# Patient Record
Sex: Female | Born: 1970 | Hispanic: No | Marital: Married | Smoking: Never smoker
Health system: Southern US, Community
[De-identification: ages and names within clinical notes are randomized; demographics above are authoritative.]

## PROBLEM LIST (undated history)

## (undated) DIAGNOSIS — T7840XA Allergy, unspecified, initial encounter: Secondary | ICD-10-CM

## (undated) HISTORY — DX: Allergy, unspecified, initial encounter: T78.40XA

## (undated) HISTORY — PX: NO PAST SURGERIES: SHX2092

---

## 2005-11-27 ENCOUNTER — Ambulatory Visit: Payer: Self-pay

## 2006-01-07 ENCOUNTER — Ambulatory Visit: Payer: Self-pay | Admitting: Obstetrics and Gynecology

## 2006-01-11 ENCOUNTER — Ambulatory Visit: Payer: Self-pay | Admitting: Obstetrics and Gynecology

## 2006-07-09 ENCOUNTER — Ambulatory Visit: Payer: Self-pay | Admitting: Obstetrics and Gynecology

## 2007-01-09 ENCOUNTER — Ambulatory Visit: Payer: Self-pay | Admitting: Obstetrics and Gynecology

## 2007-02-02 ENCOUNTER — Emergency Department: Payer: Self-pay | Admitting: Emergency Medicine

## 2008-03-10 ENCOUNTER — Ambulatory Visit: Payer: Self-pay | Admitting: Obstetrics and Gynecology

## 2009-03-14 ENCOUNTER — Ambulatory Visit: Payer: Self-pay | Admitting: Obstetrics and Gynecology

## 2010-03-15 ENCOUNTER — Ambulatory Visit: Payer: Self-pay | Admitting: Obstetrics and Gynecology

## 2010-03-16 ENCOUNTER — Ambulatory Visit: Payer: Self-pay | Admitting: Obstetrics and Gynecology

## 2011-03-19 ENCOUNTER — Ambulatory Visit: Payer: Self-pay | Admitting: Obstetrics and Gynecology

## 2012-03-21 ENCOUNTER — Ambulatory Visit: Payer: Self-pay | Admitting: Obstetrics and Gynecology

## 2013-03-23 ENCOUNTER — Ambulatory Visit: Payer: Self-pay

## 2013-06-30 ENCOUNTER — Emergency Department: Payer: Self-pay | Admitting: Emergency Medicine

## 2013-06-30 LAB — CBC WITH DIFFERENTIAL/PLATELET
BASOS ABS: 0.1 10*3/uL (ref 0.0–0.1)
BASOS PCT: 0.8 %
EOS ABS: 0.5 10*3/uL (ref 0.0–0.7)
Eosinophil %: 5 %
HCT: 34.9 % — ABNORMAL LOW (ref 35.0–47.0)
HGB: 10.7 g/dL — AB (ref 12.0–16.0)
LYMPHS ABS: 1.9 10*3/uL (ref 1.0–3.6)
Lymphocyte %: 20.7 %
MCH: 21.2 pg — AB (ref 26.0–34.0)
MCHC: 30.8 g/dL — ABNORMAL LOW (ref 32.0–36.0)
MCV: 69 fL — ABNORMAL LOW (ref 80–100)
Monocyte #: 0.5 x10 3/mm (ref 0.2–0.9)
Monocyte %: 5.6 %
NEUTROS ABS: 6.2 10*3/uL (ref 1.4–6.5)
Neutrophil %: 67.9 %
PLATELETS: 515 10*3/uL — AB (ref 150–440)
RBC: 5.07 10*6/uL (ref 3.80–5.20)
RDW: 16.1 % — AB (ref 11.5–14.5)
WBC: 9.1 10*3/uL (ref 3.6–11.0)

## 2013-06-30 LAB — COMPREHENSIVE METABOLIC PANEL
AST: 21 U/L (ref 15–37)
Albumin: 3.8 g/dL (ref 3.4–5.0)
Alkaline Phosphatase: 67 U/L
Anion Gap: 7 (ref 7–16)
BILIRUBIN TOTAL: 0.4 mg/dL (ref 0.2–1.0)
BUN: 5 mg/dL — ABNORMAL LOW (ref 7–18)
CALCIUM: 9 mg/dL (ref 8.5–10.1)
Chloride: 100 mmol/L (ref 98–107)
Co2: 27 mmol/L (ref 21–32)
Creatinine: 0.8 mg/dL (ref 0.60–1.30)
EGFR (African American): >60
GLUCOSE: 112 mg/dL — AB (ref 65–99)
OSMOLALITY: 266 (ref 275–301)
Potassium: 3.4 mmol/L — ABNORMAL LOW (ref 3.5–5.1)
SGPT (ALT): 31 U/L (ref 12–78)
SODIUM: 134 mmol/L — AB (ref 136–145)
TOTAL PROTEIN: 8.2 g/dL (ref 6.4–8.2)

## 2013-06-30 LAB — D-DIMER(ARMC): D-DIMER: 550 ng/mL

## 2013-06-30 LAB — URINALYSIS, COMPLETE
BACTERIA: NONE SEEN
BILIRUBIN, UR: NEGATIVE
Glucose,UR: NEGATIVE mg/dL (ref 0–75)
KETONE: NEGATIVE
LEUKOCYTE ESTERASE: NEGATIVE
NITRITE: NEGATIVE
Ph: 6 (ref 4.5–8.0)
Protein: NEGATIVE
RBC, UR: NONE SEEN /HPF (ref 0–5)
Specific Gravity: 1.002 (ref 1.003–1.030)
Squamous Epithelial: <1

## 2013-06-30 LAB — TROPONIN I: Troponin-I: <0.02 ng/mL

## 2013-06-30 LAB — LIPASE, BLOOD: LIPASE: 75 U/L (ref 73–393)

## 2014-03-24 ENCOUNTER — Ambulatory Visit: Payer: Self-pay

## 2014-09-09 ENCOUNTER — Emergency Department: Payer: BLUE CROSS/BLUE SHIELD

## 2014-09-09 ENCOUNTER — Encounter: Payer: Self-pay | Admitting: Emergency Medicine

## 2014-09-09 ENCOUNTER — Emergency Department
Admission: EM | Admit: 2014-09-09 | Discharge: 2014-09-09 | Disposition: A | Discharge status: Home / Self Care | Payer: BLUE CROSS/BLUE SHIELD | Attending: Emergency Medicine | Admitting: Emergency Medicine

## 2014-09-09 DIAGNOSIS — R109 Unspecified abdominal pain: Secondary | ICD-10-CM | POA: Diagnosis present

## 2014-09-09 DIAGNOSIS — Z3202 Encounter for pregnancy test, result negative: Secondary | ICD-10-CM | POA: Diagnosis not present

## 2014-09-09 DIAGNOSIS — N390 Urinary tract infection, site not specified: Secondary | ICD-10-CM | POA: Diagnosis not present

## 2014-09-09 LAB — COMPREHENSIVE METABOLIC PANEL
ALBUMIN: 4 g/dL (ref 3.5–5.0)
ALT: 18 U/L (ref 14–54)
AST: 20 U/L (ref 15–41)
Alkaline Phosphatase: 57 U/L (ref 38–126)
Anion gap: 8 (ref 5–15)
BUN: 7 mg/dL (ref 6–20)
CALCIUM: 9 mg/dL (ref 8.9–10.3)
CO2: 24 mmol/L (ref 22–32)
CREATININE: 0.8 mg/dL (ref 0.44–1.00)
Chloride: 103 mmol/L (ref 101–111)
GFR calc Af Amer: >60 mL/min (ref >60)
Glucose, Bld: 106 mg/dL — ABNORMAL HIGH (ref 65–99)
POTASSIUM: 4.1 mmol/L (ref 3.5–5.1)
SODIUM: 135 mmol/L (ref 135–145)
Total Bilirubin: 0.2 mg/dL — ABNORMAL LOW (ref 0.3–1.2)
Total Protein: 8 g/dL (ref 6.5–8.1)

## 2014-09-09 LAB — URINALYSIS COMPLETE WITH MICROSCOPIC (ARMC ONLY)
BILIRUBIN URINE: NEGATIVE
GLUCOSE, UA: NEGATIVE mg/dL
HGB URINE DIPSTICK: NEGATIVE
KETONES UR: NEGATIVE mg/dL
NITRITE: POSITIVE — AB
Protein, ur: 100 mg/dL — AB
Specific Gravity, Urine: 1.016 (ref 1.005–1.030)
pH: 7 (ref 5.0–8.0)

## 2014-09-09 LAB — CBC
HEMATOCRIT: 32.2 % — AB (ref 35.0–47.0)
Hemoglobin: 10 g/dL — ABNORMAL LOW (ref 12.0–16.0)
MCH: 20.1 pg — ABNORMAL LOW (ref 26.0–34.0)
MCHC: 31 g/dL — AB (ref 32.0–36.0)
MCV: 64.9 fL — ABNORMAL LOW (ref 80.0–100.0)
PLATELETS: 595 10*3/uL — AB (ref 150–440)
RBC: 4.97 MIL/uL (ref 3.80–5.20)
RDW: 17.5 % — ABNORMAL HIGH (ref 11.5–14.5)
WBC: 11 10*3/uL (ref 3.6–11.0)

## 2014-09-09 LAB — POCT PREGNANCY, URINE: PREG TEST UR: NEGATIVE

## 2014-09-09 LAB — LIPASE, BLOOD: Lipase: 43 U/L (ref 22–51)

## 2014-09-09 MED ORDER — ONDANSETRON HCL 4 MG/2ML IJ SOLN: 4.0000 mg | Freq: Once | INTRAMUSCULAR | Status: DC

## 2014-09-09 MED ORDER — MORPHINE SULFATE 4 MG/ML IJ SOLN
INTRAMUSCULAR | Status: AC
  Filled 2014-09-09: qty 1

## 2014-09-09 MED ORDER — MORPHINE SULFATE 4 MG/ML IJ SOLN: 4.0000 mg | Freq: Once | INTRAMUSCULAR | Status: DC

## 2014-09-09 MED ORDER — ONDANSETRON HCL 4 MG/2ML IJ SOLN
INTRAMUSCULAR | Status: AC
  Filled 2014-09-09: qty 2

## 2014-09-09 MED ORDER — CIPROFLOXACIN HCL 500 MG PO TABS: 500.0000 mg | ORAL_TABLET | Freq: Two times a day (BID) | ORAL | Status: AC

## 2014-09-09 NOTE — ED Provider Notes (Signed)
[facility] Emergency Department Provider Note  ____________________________________________  Time seen: On arrival  I have reviewed the triage vital signs and the nursing notes.   HISTORY  Chief Complaint Abdominal Pain     HPI Joan Hunt is a 44 y.o. female who presents with complaints of right-sided abdominal pain for possibly 5 days. The pain has become more severe and she describes it as crampy and sharp. No fevers no chills. No nausea no vomiting. Normal bowel movements. The pain does seem to worsen after eating. No recent injury. No dysuria.     History reviewed. No pertinent past medical history.  There are no active problems to display for this patient.   History reviewed. No pertinent past surgical history.  No current outpatient prescriptions on file.  Allergies Septra  History reviewed. No pertinent family history.  Social History History  Substance Use Topics  . Smoking status: Never Smoker   . Smokeless tobacco: Not on file  . Alcohol Use: No    Review of Systems  Constitutional: Negative for fever. Eyes: Negative for visual changes. ENT: Negative for sore throat Cardiovascular: Negative for chest pain. Respiratory: Negative for shortness of breath. Gastrointestinal: Negative for  vomiting and diarrhea. Genitourinary: Negative for dysuria. Musculoskeletal: Negative for back pain. Skin: Negative for rash. Neurological: Negative for headaches or focal weakness Psychiatric: no anxiety or depression  10-point ROS otherwise negative.  ____________________________________________   PHYSICAL EXAM:  VITAL SIGNS: ED Triage Vitals  Enc Vitals Group     BP 09/09/14 0852 94/80 mmHg     Pulse Rate 09/09/14 0852 81     Resp --      Temp 09/09/14 0852 98.5 F (36.9 C)     Temp Source 09/09/14 0852 Oral     SpO2 09/09/14 0852 100 %     Weight 09/09/14 0852 158 lb (71.668 kg)     Height 09/09/14 0852 5\' 2"  (1.575 m)    Head Cir --      Peak Flow --      Pain Score 09/09/14 0847 6     Pain Loc --      Pain Edu? --      Excl. in GC? --      Constitutional: Alert and oriented. Well appearing and in no distress. Eyes: Conjunctivae are normal.  ENT   Head: Normocephalic and atraumatic.   Mouth/Throat: Mucous membranes are moist. Cardiovascular: Normal rate, regular rhythm. Normal and symmetric distal pulses are present in all extremities. No murmurs, rubs, or gallops. Respiratory: Normal respiratory effort without tachypnea nor retractions. Breath sounds are clear and equal bilaterally.  Gastrointestinal: Mild tenderness to palpation in the right lower quadrant and right upper quadrant. No rebound tenderness and no evidence of peritonitis. No distention. There is no CVA tenderness. Genitourinary: deferred Musculoskeletal: Nontender with normal range of motion in all extremities. No lower extremity tenderness nor edema. Neurologic:  Normal speech and language. No gross focal neurologic deficits are appreciated. Skin:  Skin is warm, dry and intact. No rash noted. Psychiatric: Mood and affect are normal. Patient exhibits appropriate insight and judgment.  ____________________________________________    LABS (pertinent positives/negatives)  Labs Reviewed  CBC  COMPREHENSIVE METABOLIC PANEL  LIPASE, BLOOD  URINALYSIS COMPLETEWITH MICROSCOPIC ([facility] ONLY)    ____________________________________________   EKG  None  ____________________________________________    RADIOLOGY  CT scan of abdomen and pelvis unremarkable, patient has known fibroids  Ultrasound of pelvis unremarkable   ____________________________________________   PROCEDURES  Procedure(s)  performed: none  Critical Care performed: none  ____________________________________________   INITIAL IMPRESSION / ASSESSMENT AND PLAN / ED COURSE  Pertinent labs & imaging results that were available during my care of the  patient were reviewed by me and considered in my medical decision making (see chart for details).  Overall patient well-appearing , with mild discomfort in the abdomen to be worsening. We will check labs, give IV fluids, IV pain medication, IV Zofran and reevaluate   ----------------------------------------- 12:32 PM on 09/09/2014 ----------------------------------------- Labs CT scan unremarkable except for likely urinary tract infection which is probably the source of the patient's discomfort. We will obtain ultrasound  ____________________________________________ Ultrasound normal  FINAL CLINICAL IMPRESSION(S) / ED DIAGNOSES  Final diagnoses:  Flank pain  UTI (lower urinary tract infection)     Jene Every, MD 09/09/14 1515

## 2014-09-09 NOTE — Discharge Instructions (Signed)

## 2014-09-09 NOTE — ED Notes (Signed)
RLQ pain for the last several days. No fever. Has had some diarrhea.

## 2015-01-18 ENCOUNTER — Other Ambulatory Visit: Payer: Self-pay | Admitting: Obstetrics and Gynecology

## 2015-01-18 DIAGNOSIS — Z1231 Encounter for screening mammogram for malignant neoplasm of breast: Secondary | ICD-10-CM

## 2015-03-28 ENCOUNTER — Ambulatory Visit
Admission: RE | Admit: 2015-03-28 | Discharge: 2015-03-28 | Disposition: A | Discharge status: Home / Self Care | Payer: BLUE CROSS/BLUE SHIELD | Source: Ambulatory Visit | Attending: Obstetrics and Gynecology | Admitting: Obstetrics and Gynecology

## 2015-03-28 DIAGNOSIS — Z1231 Encounter for screening mammogram for malignant neoplasm of breast: Secondary | ICD-10-CM | POA: Diagnosis not present

## 2016-01-19 ENCOUNTER — Other Ambulatory Visit: Payer: Self-pay | Admitting: Obstetrics and Gynecology

## 2016-01-19 DIAGNOSIS — Z1231 Encounter for screening mammogram for malignant neoplasm of breast: Secondary | ICD-10-CM

## 2016-01-19 DIAGNOSIS — Z01419 Encounter for gynecological examination (general) (routine) without abnormal findings: Secondary | ICD-10-CM | POA: Diagnosis not present

## 2016-03-21 DIAGNOSIS — J029 Acute pharyngitis, unspecified: Secondary | ICD-10-CM | POA: Diagnosis not present

## 2016-03-21 DIAGNOSIS — R6889 Other general symptoms and signs: Secondary | ICD-10-CM | POA: Diagnosis not present

## 2016-03-29 ENCOUNTER — Ambulatory Visit: Payer: BLUE CROSS/BLUE SHIELD

## 2016-04-13 DIAGNOSIS — M791 Myalgia: Secondary | ICD-10-CM | POA: Diagnosis not present

## 2016-04-13 DIAGNOSIS — R05 Cough: Secondary | ICD-10-CM | POA: Diagnosis not present

## 2016-04-26 ENCOUNTER — Ambulatory Visit
Admission: RE | Admit: 2016-04-26 | Discharge: 2016-04-26 | Disposition: A | Discharge status: Home / Self Care | Payer: BLUE CROSS/BLUE SHIELD | Source: Ambulatory Visit | Attending: Obstetrics and Gynecology | Admitting: Obstetrics and Gynecology

## 2016-04-26 DIAGNOSIS — Z1231 Encounter for screening mammogram for malignant neoplasm of breast: Secondary | ICD-10-CM | POA: Diagnosis not present

## 2016-06-07 DIAGNOSIS — J019 Acute sinusitis, unspecified: Secondary | ICD-10-CM | POA: Diagnosis not present

## 2016-11-05 DIAGNOSIS — N762 Acute vulvitis: Secondary | ICD-10-CM | POA: Diagnosis not present

## 2016-11-22 DIAGNOSIS — N9089 Other specified noninflammatory disorders of vulva and perineum: Secondary | ICD-10-CM | POA: Diagnosis not present

## 2016-12-16 DIAGNOSIS — Z23 Encounter for immunization: Secondary | ICD-10-CM | POA: Diagnosis not present

## 2017-03-20 ENCOUNTER — Other Ambulatory Visit: Payer: Self-pay | Admitting: Obstetrics and Gynecology

## 2017-03-20 DIAGNOSIS — Z01419 Encounter for gynecological examination (general) (routine) without abnormal findings: Secondary | ICD-10-CM | POA: Diagnosis not present

## 2017-03-20 DIAGNOSIS — Z1231 Encounter for screening mammogram for malignant neoplasm of breast: Secondary | ICD-10-CM

## 2017-03-22 LAB — HM PAP SMEAR: HM PAP: NEGATIVE

## 2017-04-26 DIAGNOSIS — R6889 Other general symptoms and signs: Secondary | ICD-10-CM | POA: Diagnosis not present

## 2017-04-26 DIAGNOSIS — J019 Acute sinusitis, unspecified: Secondary | ICD-10-CM | POA: Diagnosis not present

## 2017-04-26 DIAGNOSIS — B9689 Other specified bacterial agents as the cause of diseases classified elsewhere: Secondary | ICD-10-CM | POA: Diagnosis not present

## 2017-04-26 DIAGNOSIS — B373 Candidiasis of vulva and vagina: Secondary | ICD-10-CM | POA: Diagnosis not present

## 2017-04-29 ENCOUNTER — Ambulatory Visit
Admission: RE | Admit: 2017-04-29 | Discharge: 2017-04-29 | Disposition: A | Discharge status: Home / Self Care | Payer: BLUE CROSS/BLUE SHIELD | Source: Ambulatory Visit | Attending: Obstetrics and Gynecology | Admitting: Obstetrics and Gynecology

## 2017-04-29 DIAGNOSIS — Z1231 Encounter for screening mammogram for malignant neoplasm of breast: Secondary | ICD-10-CM | POA: Insufficient documentation

## 2017-05-24 IMAGING — MG MM DIGITAL SCREENING BILAT W/ CAD
4 series · 4 of 4 positions shown · non-contrast
Comparison: Previous exam(s).

CLINICAL DATA: Screening.

EXAM:
DIGITAL SCREENING BILATERAL MAMMOGRAM WITH CAD

[R CC]
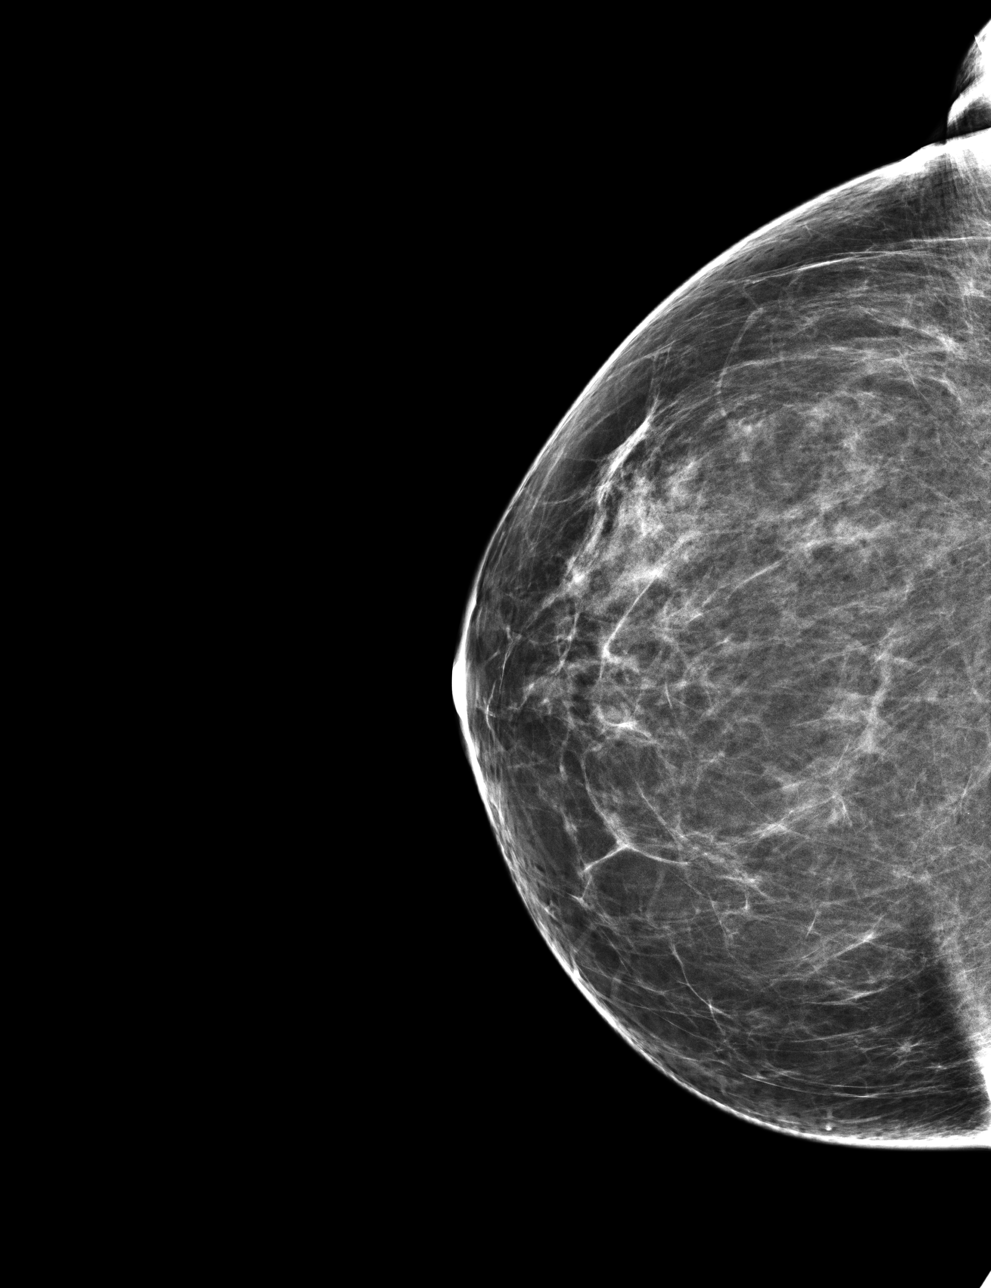

[L MLO]
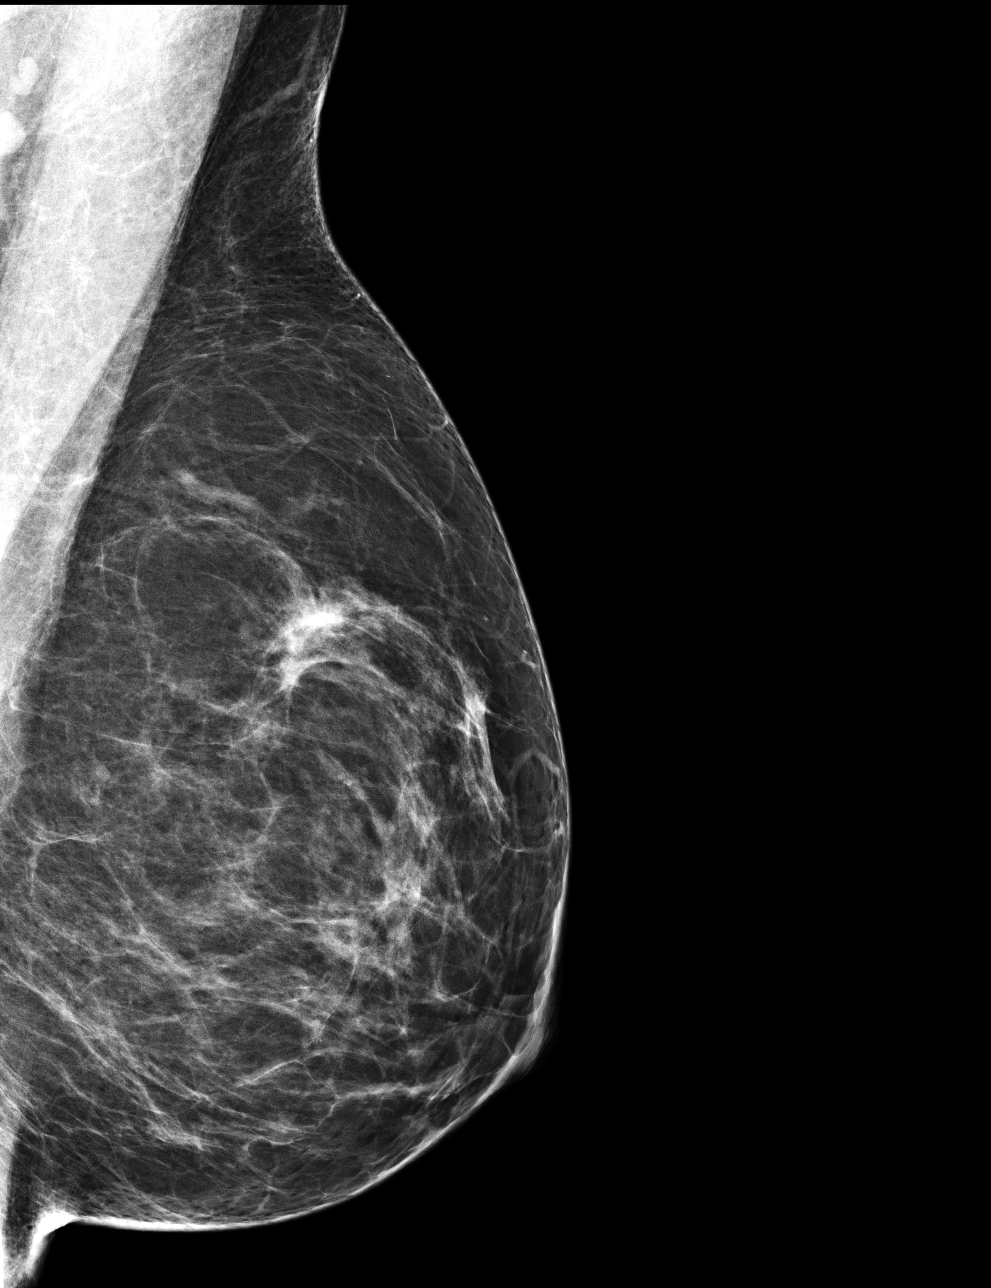

[R MLO]
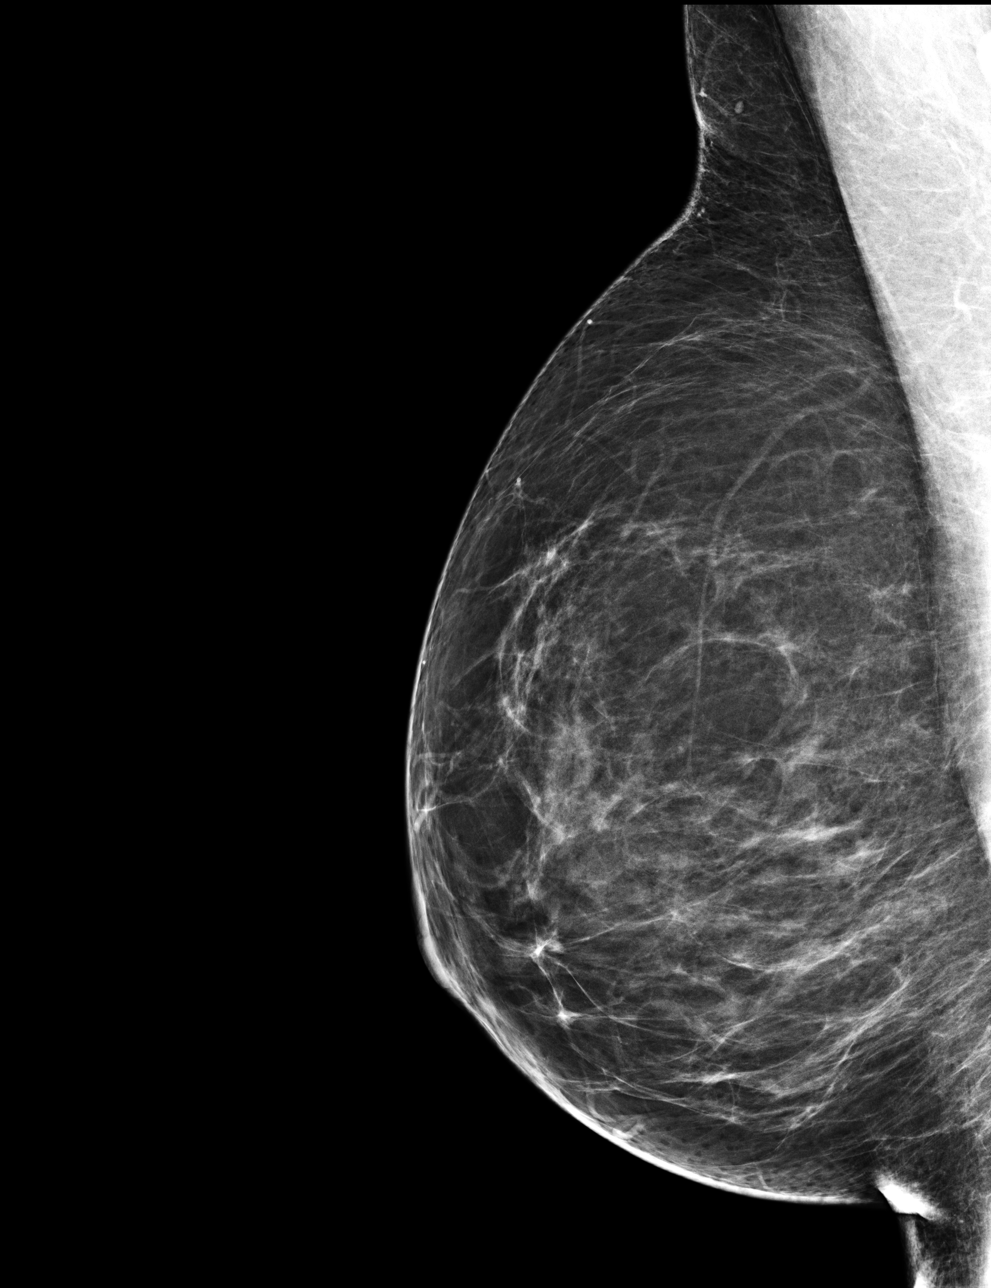

[L CC]
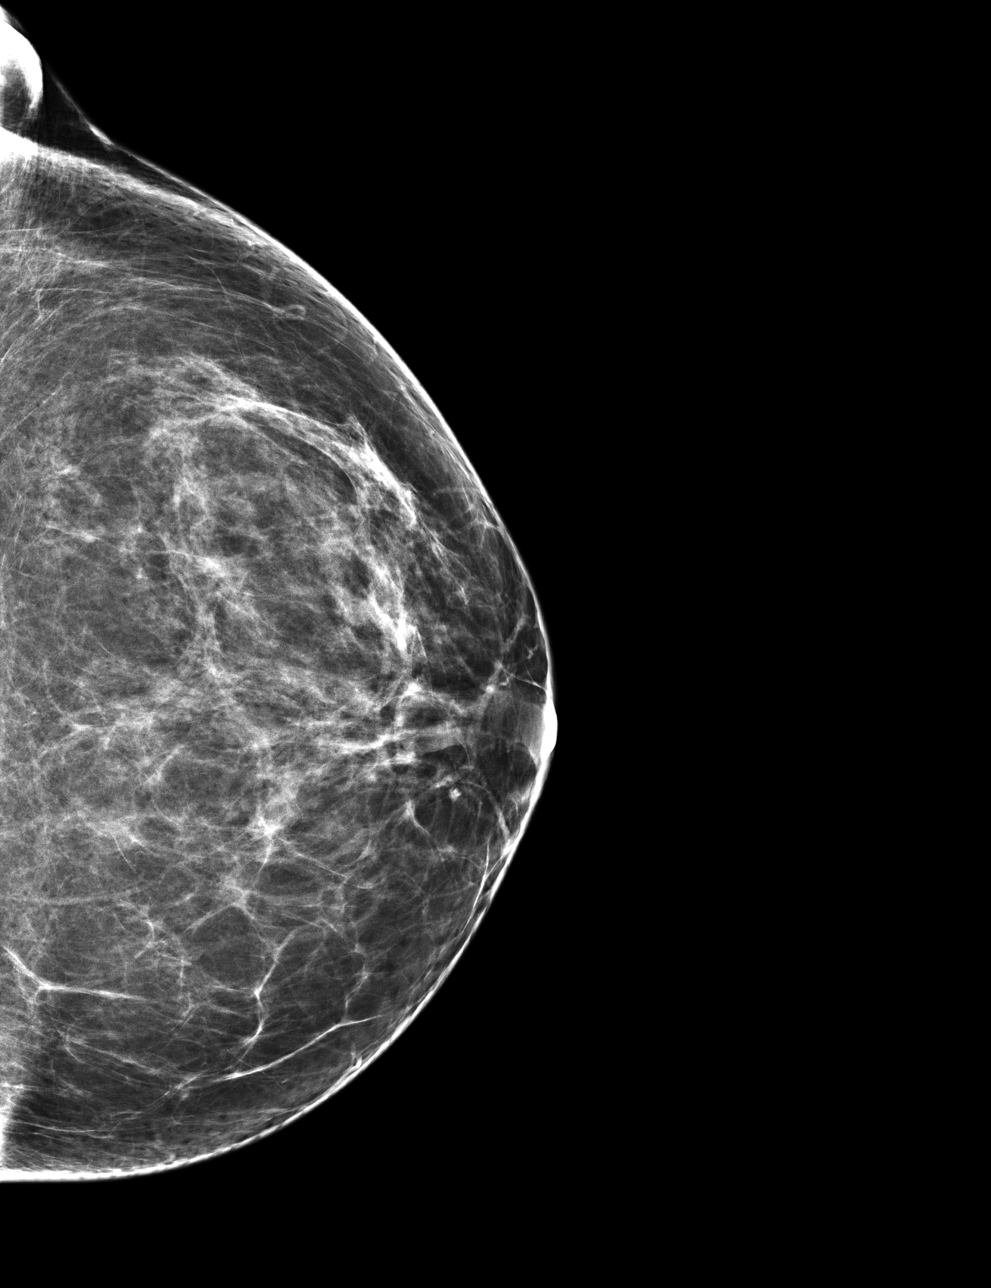

[4 of 4 positions shown; findings below may reference images not displayed]

ACR Breast Density Category b: There are scattered areas of
fibroglandular density.
FINDINGS: There are no findings suspicious for malignancy. Images were
processed with CAD.
IMPRESSION: No mammographic evidence of malignancy. A result letter of this
screening mammogram will be mailed directly to the patient.

RECOMMENDATION:
Screening mammogram in one year. (Code:AS-G-LCT)

BI-RADS CATEGORY  1: Negative.

## 2017-06-03 ENCOUNTER — Encounter: Payer: Self-pay | Admitting: Family Medicine

## 2017-06-03 ENCOUNTER — Ambulatory Visit (INDEPENDENT_AMBULATORY_CARE_PROVIDER_SITE_OTHER): Payer: BLUE CROSS/BLUE SHIELD | Admitting: Family Medicine

## 2017-06-03 VITALS — BP 102/72 | HR 91 | Temp 97.9°F | Resp 16 | Ht 62.5 in | Wt 170.0 lb

## 2017-06-03 DIAGNOSIS — J302 Other seasonal allergic rhinitis: Secondary | ICD-10-CM

## 2017-06-03 DIAGNOSIS — L918 Other hypertrophic disorders of the skin: Secondary | ICD-10-CM | POA: Diagnosis not present

## 2017-06-03 DIAGNOSIS — Z6830 Body mass index (BMI) 30.0-30.9, adult: Secondary | ICD-10-CM

## 2017-06-03 DIAGNOSIS — D649 Anemia, unspecified: Secondary | ICD-10-CM | POA: Diagnosis not present

## 2017-06-03 DIAGNOSIS — E669 Obesity, unspecified: Secondary | ICD-10-CM | POA: Diagnosis not present

## 2017-06-03 DIAGNOSIS — J309 Allergic rhinitis, unspecified: Secondary | ICD-10-CM | POA: Insufficient documentation

## 2017-06-03 DIAGNOSIS — I8390 Asymptomatic varicose veins of unspecified lower extremity: Secondary | ICD-10-CM | POA: Insufficient documentation

## 2017-06-03 DIAGNOSIS — I8393 Asymptomatic varicose veins of bilateral lower extremities: Secondary | ICD-10-CM | POA: Diagnosis not present

## 2017-06-03 DIAGNOSIS — R0602 Shortness of breath: Secondary | ICD-10-CM | POA: Diagnosis not present

## 2017-06-03 NOTE — Assessment & Plan Note (Signed)
Suspect that this is related to deconditioning Patient still has a functional level of exercise without shortness of breath Discussed red flag symptoms for cardiac disease Follow-up as needed

## 2017-06-03 NOTE — Assessment & Plan Note (Signed)
Patient with multiple skin tags around her necklace line  Advised that these could be removed if desired

## 2017-06-03 NOTE — Progress Notes (Signed)
Patient: Joan Hunt, Female    DOB: 1970/04/18, 47 y.o.   MRN: [id number] Visit Date: 06/03/2017  Today's Provider: Shirlee Latch, MD   I, Joan Hunt, CMA, am acting as scribe for Joan Latch, MD.  Chief Complaint  Patient presents with  . Establish Care   Subjective:    Establish Care Joan Hunt is a 47 y.o. female who presents today to establish care. She feels fairly well. She is c/o leg pain. Her family history includes vascular disease, and she is asking if she needs to see a specialist regarding this. She is also concerned about skin changes, and would like to know how much calcium she should have daily. She reports exercising irregularly; is walking some. She reports she is sleeping fairly well.  Last pap- 03/22/2017- NIL; HPV negative Last mammogram- 04/29/2017- BI-RADS 1  Varicose veins: Patient is concerned about varicose veins in bilateral legs.  She states that her legs are sore and this is worse after standing for long periods of time.  She has minimal ankle swelling intermittently.  This is worse after eating high sodium meals or standing for long periods of time.  The veins are more prominent in the backs of her legs.  She is concerned because her maternal Hunt "vein problems "as she aged.  Patient states that she is out of shape.  She stopped exercising for a while and now that she has started back, she feels short of breath when she is exercising.  She is still able to walk up a flight of stairs and walk from the parking lot without becoming short of breath.  She denies any diaphoresis, nausea, chest pain that accompanies this exercise.  Patient is concerned "moles" around her neck.  States her Joan Hunt has several of these as well.  They have not changed in shape or size, do not bleed, are not painful.  Patient reports seasonal allergy symptoms.  She previously had several sinus infections annually, but this has resolved since she  moved to working in a newer building.  She is not taking any allergy medications currently. -----------------------------------------------------------------   Review of Systems  Constitutional: Positive for fatigue. Negative for activity change, appetite change, chills, diaphoresis, fever and unexpected weight change.  HENT: Positive for congestion, dental problem, sinus pressure and sneezing. Negative for drooling, ear discharge, ear pain, facial swelling, hearing loss, mouth sores, nosebleeds, postnasal drip, rhinorrhea, sinus pain, sore throat, tinnitus, trouble swallowing and voice change.   Eyes: Positive for redness. Negative for photophobia, pain, discharge, itching and visual disturbance.  Respiratory: Positive for cough and shortness of breath. Negative for apnea, choking, chest tightness, wheezing and stridor.   Cardiovascular: Negative.   Gastrointestinal: Negative.   Endocrine: Negative.   Genitourinary: Negative.   Musculoskeletal: Positive for back pain and myalgias. Negative for arthralgias, gait problem, joint swelling, neck pain and neck stiffness.  Skin: Negative.   Allergic/Immunologic: Negative.   Neurological: Positive for headaches. Negative for dizziness, tremors, seizures, syncope, facial asymmetry, speech difficulty, weakness, light-headedness and numbness.  Hematological: Negative.   Psychiatric/Behavioral: Negative.     Social History      She  reports that she has never smoked. She has never used smokeless tobacco. She reports that she does not drink alcohol or use drugs.       Social History   Socioeconomic History  . Marital status: Married    Spouse name: Joan Hunt.  . Number of children: 2  .  Years of education: Not on file  . Highest education level: Master's degree (e.g., MA, MS, MEng, MEd, MSW, MBA)  Occupational History  . Occupation: unemployed  Social Needs  . Financial resource strain: Not hard at all  . Food insecurity:    Worry:  Never true    Inability: Never true  . Transportation needs:    Medical: No    Non-medical: No  Tobacco Use  . Smoking status: Never Smoker  . Smokeless tobacco: Never Used  Substance and Sexual Activity  . Alcohol use: No  . Drug use: Never  . Sexual activity: Yes    Birth control/protection: Patch  Lifestyle  . Physical activity:    Days per week: 0 days    Minutes per session: 0 min  . Stress: Not on file  Relationships  . Social connections:    Talks on phone: Not on file    Gets together: Not on file    Attends religious service: Not on file    Active member of club or organization: Not on file    Attends meetings of clubs or organizations: Not on file    Relationship status: Not on file  Other Topics Concern  . Not on file  Social History Narrative  . Not on file    Past Medical History:  Diagnosis Date  . Allergy      There are no active problems to display for this patient.   Past Surgical History:  Procedure Laterality Date  . NO PAST SURGERIES      Family History        Family Status  Relation Name Status  . Joan Hunt  (Not Specified)  . Joan Hunt  Alive  . Joan Hunt  Alive  . Joan Hunt  Alive  . Joan Hunt  Alive  . Joan Hunt  Alive  . Joan Hunt  Alive  . Joan Hunt  (Not Specified)  . Joan Hunt  (Not Specified)  . Joan Hunt  Alive  . Joan Hunt  (Not Specified)        Her family history includes Allergic rhinitis in her Joan Hunt; Breast cancer in her maternal Hunt; Cancer in her maternal Hunt; Diabetes in her maternal Hunt; Emphysema in her maternal grandfather; Goiter in her Joan Hunt; Healthy in her Joan Hunt and Joan Hunt; Heart block in her Joan Hunt; Heart murmur in her Joan Hunt and Joan Hunt; Hyperthyroidism in her Joan Hunt; Sickle cell trait in her Joan Hunt; Stroke in her paternal grandmother. There is no history of Colon cancer, Cervical cancer, or Ovarian cancer.      Allergies  Allergen Reactions  . Septra [Sulfamethoxazole-Trimethoprim] Anaphylaxis  . Prednisone Other (See Comments)     "feel edgy"     Current Outpatient Medications:  .  calcium carbonate (TUMS - DOSED IN MG ELEMENTAL CALCIUM) 500 MG chewable tablet, Chew 2 tablets by mouth daily., Disp: , Rfl:  .  XULANE 150-35 MCG/24HR transdermal patch, PLACE 1 PATCH ONTO THE SKIN ONCE A WEEK., Disp: , Rfl: 3   Patient Care Team: Erasmo DownerBacigalupo, Landrey M, MD as PCP - General (Family Medicine)      Objective:   Vitals: BP 102/72 (BP Location: Left Arm, Patient Position: Sitting, Cuff Size: Normal)   Pulse 91   Temp 97.9 F (36.6 C) (Oral)   Resp 16   Ht 5' 2.5" (1.588 m)   Wt 170 lb (77.1 kg)   LMP 05/29/2017   SpO2 98%   BMI 30.60 kg/m    Vitals:   06/03/17 1007  BP: 102/72  Pulse:  91  Resp: 16  Temp: 97.9 F (36.6 C)  TempSrc: Oral  SpO2: 98%  Weight: 170 lb (77.1 kg)  Height: 5' 2.5" (1.588 m)     Physical Exam  Constitutional: She is oriented to person, place, and time. She appears well-developed and well-nourished. No distress.  HENT:  Head: Normocephalic and atraumatic.  Right Ear: External ear normal.  Left Ear: External ear normal.  Nose: Nose normal.  Mouth/Throat: Oropharynx is clear and moist. No oropharyngeal exudate.  Eyes: Pupils are equal, round, and reactive to light. Conjunctivae and EOM are normal. Right eye exhibits no discharge. Left eye exhibits no discharge. No scleral icterus.  Neck: Neck supple. No thyromegaly present.  Cardiovascular: Normal rate, regular rhythm, normal heart sounds and intact distal pulses.  No murmur heard. Pulmonary/Chest: Effort normal and breath sounds normal. No respiratory distress. She has no wheezes. She has no rales.  Abdominal: Soft. Bowel sounds are normal. She exhibits no distension. There is no tenderness. There is no rebound and no guarding.  Musculoskeletal: She exhibits edema (trace, bilateral ankles). She exhibits no deformity.  Faint superficial vein changes in bilateral legs  Lymphadenopathy:    She has no cervical adenopathy.    Neurological: She is alert and oriented to person, place, and time.  Skin: Skin is warm and dry. No rash noted.  Psychiatric: She has a normal mood and affect. Her behavior is normal.  Vitals reviewed.    Depression Screen PHQ 2/9 Scores 06/03/2017  PHQ - 2 Score 0     Assessment & Plan:     Problem List Items Addressed This Visit      Cardiovascular and Mediastinum   Varicose vein of leg - Primary    Minimal and not painful Advised elevation of legs, compression socks, low-sodium diet to avoid edema        Respiratory   Allergic rhinitis    Continue symptomatic management Could consider Flonase or daily antihistamine in the future        Musculoskeletal and Integument   Skin tag    Patient with multiple skin tags around her necklace line  Advised that these could be removed if desired        Other   Shortness of breath on exertion    Suspect that this is related to deconditioning Patient still has a functional level of exercise without shortness of breath Discussed red flag symptoms for cardiac disease Follow-up as needed       Other Visit Diagnoses    Class 1 obesity without serious comorbidity with body mass index (BMI) of 30.0 to 30.9 in adult, unspecified obesity type       Relevant Medications   calcium carbonate (TUMS - DOSED IN MG ELEMENTAL CALCIUM) 500 MG chewable tablet   Other Relevant Orders   Lipid panel   Comprehensive metabolic panel   Anemia, unspecified type       Relevant Orders   CBC w/Diff/Platelet      Reviewed notes from gynecology as well as most recent labs from 2016  Return in about 1 year (around 06/04/2018) for physical.   The entirety of the information documented in the History of Present Illness, Review of Systems and Physical Exam were personally obtained by me. Portions of this information were initially documented by Irving Burton Ratchford, CMA and reviewed by me for thoroughness and accuracy.    Erasmo Downer, MD,  MPH [facility] 06/03/2017 11:47 AM

## 2017-06-03 NOTE — Patient Instructions (Signed)
Preventive Care 40-64 Years, Female Preventive care refers to lifestyle choices and visits with your health care provider that can promote health and wellness. What does preventive care include?  A yearly physical exam. This is also called an annual well check.  Dental exams once or twice a year.  Routine eye exams. Ask your health care provider how often you should have your eyes checked.  Personal lifestyle choices, including: ? Daily care of your teeth and gums. ? Regular physical activity. ? Eating a healthy diet. ? Avoiding tobacco and drug use. ? Limiting alcohol use. ? Practicing safe sex. ? Taking low-dose aspirin daily starting at age 58. ? Taking vitamin and mineral supplements as recommended by your health care provider. What happens during an annual well check? The services and screenings done by your health care provider during your annual well check will depend on your age, overall health, lifestyle risk factors, and family history of disease. Counseling Your health care provider may ask you questions about your:  Alcohol use.  Tobacco use.  Drug use.  Emotional well-being.  Home and relationship well-being.  Sexual activity.  Eating habits.  Work and work Statistician.  Method of birth control.  Menstrual cycle.  Pregnancy history.  Screening You may have the following tests or measurements:  Height, weight, and BMI.  Blood pressure.  Lipid and cholesterol levels. These may be checked every 5 years, or more frequently if you are over 81 years old.  Skin check.  Lung cancer screening. You may have this screening every year starting at age 78 if you have a 30-pack-year history of smoking and currently smoke or have quit within the past 15 years.  Fecal occult blood test (FOBT) of the stool. You may have this test every year starting at age 65.  Flexible sigmoidoscopy or colonoscopy. You may have a sigmoidoscopy every 5 years or a colonoscopy  every 10 years starting at age 30.  Hepatitis C blood test.  Hepatitis B blood test.  Sexually transmitted disease (STD) testing.  Diabetes screening. This is done by checking your blood sugar (glucose) after you have not eaten for a while (fasting). You may have this done every 1-3 years.  Mammogram. This may be done every 1-2 years. Talk to your health care provider about when you should start having regular mammograms. This may depend on whether you have a family history of breast cancer.  BRCA-related cancer screening. This may be done if you have a family history of breast, ovarian, tubal, or peritoneal cancers.  Pelvic exam and Pap test. This may be done every 3 years starting at age 80. Starting at age 36, this may be done every 5 years if you have a Pap test in combination with an HPV test.  Bone density scan. This is done to screen for osteoporosis. You may have this scan if you are at high risk for osteoporosis.  Discuss your test results, treatment options, and if necessary, the need for more tests with your health care provider. Vaccines Your health care provider may recommend certain vaccines, such as:  Influenza vaccine. This is recommended every year.  Tetanus, diphtheria, and acellular pertussis (Tdap, Td) vaccine. You may need a Td booster every 10 years.  Varicella vaccine. You may need this if you have not been vaccinated.  Zoster vaccine. You may need this after age 5.  Measles, mumps, and rubella (MMR) vaccine. You may need at least one dose of MMR if you were born in  1959 or later. You may also need a second dose.  Pneumococcal 13-valent conjugate (PCV13) vaccine. You may need this if you have certain conditions and were not previously vaccinated.  Pneumococcal polysaccharide (PPSV23) vaccine. You may need one or two doses if you smoke cigarettes or if you have certain conditions.  Meningococcal vaccine. You may need this if you have certain  conditions.  Hepatitis A vaccine. You may need this if you have certain conditions or if you travel or work in places where you may be exposed to hepatitis A.  Hepatitis B vaccine. You may need this if you have certain conditions or if you travel or work in places where you may be exposed to hepatitis B.  Haemophilus influenzae type b (Hib) vaccine. You may need this if you have certain conditions.  Talk to your health care provider about which screenings and vaccines you need and how often you need them. This information is not intended to replace advice given to you by your health care provider. Make sure you discuss any questions you have with your health care provider. Document Released: 03/25/2015 Document Revised: 11/16/2015 Document Reviewed: 12/28/2014 Elsevier Interactive Patient Education  2018 Elsevier Inc.  

## 2017-06-03 NOTE — Assessment & Plan Note (Signed)
Minimal and not painful Advised elevation of legs, compression socks, low-sodium diet to avoid edema

## 2017-06-03 NOTE — Assessment & Plan Note (Signed)
Continue symptomatic management Could consider Flonase or daily antihistamine in the future

## 2017-06-05 DIAGNOSIS — D649 Anemia, unspecified: Secondary | ICD-10-CM | POA: Diagnosis not present

## 2017-06-05 DIAGNOSIS — Z6830 Body mass index (BMI) 30.0-30.9, adult: Secondary | ICD-10-CM | POA: Diagnosis not present

## 2017-06-05 DIAGNOSIS — D563 Thalassemia minor: Secondary | ICD-10-CM | POA: Diagnosis not present

## 2017-06-05 DIAGNOSIS — E669 Obesity, unspecified: Secondary | ICD-10-CM | POA: Diagnosis not present

## 2017-06-06 ENCOUNTER — Telehealth: Payer: Self-pay

## 2017-06-06 LAB — CBC WITH DIFFERENTIAL/PLATELET
BASOS ABS: 0.1 10*3/uL (ref 0.0–0.2)
Basos: 1 %
EOS (ABSOLUTE): 0.5 10*3/uL — AB (ref 0.0–0.4)
Eos: 4 %
Hematocrit: 34.7 % (ref 34.0–46.6)
Hemoglobin: 11.1 g/dL (ref 11.1–15.9)
IMMATURE GRANS (ABS): 0 10*3/uL (ref 0.0–0.1)
IMMATURE GRANULOCYTES: 0 %
LYMPHS: 25 %
Lymphocytes Absolute: 2.6 10*3/uL (ref 0.7–3.1)
MCH: 22.7 pg — AB (ref 26.6–33.0)
MCHC: 32 g/dL (ref 31.5–35.7)
MCV: 71 fL — ABNORMAL LOW (ref 79–97)
Monocytes Absolute: 0.6 10*3/uL (ref 0.1–0.9)
Monocytes: 6 %
NEUTROS PCT: 64 %
Neutrophils Absolute: 6.9 10*3/uL (ref 1.4–7.0)
PLATELETS: 568 10*3/uL — AB (ref 150–379)
RBC: 4.89 x10E6/uL (ref 3.77–5.28)
RDW: 16.8 % — ABNORMAL HIGH (ref 12.3–15.4)
WBC: 10.6 10*3/uL (ref 3.4–10.8)

## 2017-06-06 LAB — COMPREHENSIVE METABOLIC PANEL
A/G RATIO: 1.5 (ref 1.2–2.2)
ALT: 15 IU/L (ref 0–32)
AST: 15 IU/L (ref 0–40)
Albumin: 4.5 g/dL (ref 3.5–5.5)
Alkaline Phosphatase: 73 IU/L (ref 39–117)
BILIRUBIN TOTAL: 0.4 mg/dL (ref 0.0–1.2)
BUN/Creatinine Ratio: 11 (ref 9–23)
BUN: 9 mg/dL (ref 6–24)
CHLORIDE: 100 mmol/L (ref 96–106)
CO2: 19 mmol/L — ABNORMAL LOW (ref 20–29)
Calcium: 9.6 mg/dL (ref 8.7–10.2)
Creatinine, Ser: 0.82 mg/dL (ref 0.57–1.00)
GFR calc Af Amer: 97 mL/min/{1.73_m2} (ref >59)
GFR calc non Af Amer: 84 mL/min/{1.73_m2} (ref >59)
GLUCOSE: 95 mg/dL (ref 65–99)
Globulin, Total: 3 g/dL (ref 1.5–4.5)
POTASSIUM: 4.7 mmol/L (ref 3.5–5.2)
Sodium: 137 mmol/L (ref 134–144)
TOTAL PROTEIN: 7.5 g/dL (ref 6.0–8.5)

## 2017-06-06 LAB — LIPID PANEL
CHOL/HDL RATIO: 5.5 ratio — AB (ref 0.0–4.4)
Cholesterol, Total: 219 mg/dL — ABNORMAL HIGH (ref 100–199)
HDL: 40 mg/dL (ref >39)
LDL CALC: 134 mg/dL — AB (ref 0–99)
Triglycerides: 227 mg/dL — ABNORMAL HIGH (ref 0–149)
VLDL Cholesterol Cal: 45 mg/dL — ABNORMAL HIGH (ref 5–40)

## 2017-06-06 NOTE — Telephone Encounter (Signed)
-----   Message from Erasmo DownerAngela M Bacigalupo, MD sent at 06/06/2017 11:19 AM EDT ----- Cholesterol is high, but 10 year risk of heart disease/stroke is low at 1.3%.  No need for medicaitons at this time.  Recommend diet (low in saturated fats) and regular exercise. Normal kidney function, liver function, electrolytes, blood counts.  Size of red blood cells is small, which could represent iron deficiency,  So we will send confirmatory testing Irving Burton(Anival Pasha please add iron panel).  Erasmo DownerBacigalupo, Yvetta M, MD, MPH [facility] 06/06/2017 11:19 AM

## 2017-06-06 NOTE — Telephone Encounter (Signed)
Pt advised and asked phlebotomist to add on labs.

## 2017-06-08 LAB — IRON AND TIBC
Iron Saturation: 8 % — CL (ref 15–55)
Iron: 37 ug/dL (ref 27–159)
Total Iron Binding Capacity: 465 ug/dL — ABNORMAL HIGH (ref 250–450)
UIBC: 428 ug/dL — ABNORMAL HIGH (ref 131–425)

## 2017-06-08 LAB — SPECIMEN STATUS REPORT

## 2017-06-08 LAB — FERRITIN: FERRITIN: 10 ng/mL — AB (ref 15–150)

## 2017-06-10 ENCOUNTER — Telehealth: Payer: Self-pay

## 2017-06-10 NOTE — Telephone Encounter (Signed)
Pt advised of results. Would like to schedule 6 month FU tomorrow. Is requesting a call back on 06/11/2017 to schedule this.

## 2017-06-10 NOTE — Telephone Encounter (Signed)
-----   Message from Erasmo DownerAngela M Bacigalupo, MD sent at 06/10/2017  8:49 AM EDT ----- Labs show iron deficiency, despite normal blood counts.  Recommend daily iron supplement-ferrous sulfate 325 mg daily.  This is available over-the-counter.  We will recheck levels in about 6 months.  Iron supplements can cause constipation, so can take iron supplement with Colace or another stool softener as needed.

## 2017-06-14 NOTE — Telephone Encounter (Signed)
Yes

## 2017-06-14 NOTE — Telephone Encounter (Signed)
Joan Hunt, It looks like she has an appointment scheduled with Dr B in I think it was Sept.  Is that ok and if so can we close this note? Thanks

## 2017-07-19 ENCOUNTER — Telehealth: Payer: Self-pay | Admitting: Family Medicine

## 2017-07-19 MED ORDER — FLUCONAZOLE 150 MG PO TABS: 150.0000 mg | ORAL_TABLET | Freq: Once | ORAL | 0 refills | Status: AC

## 2017-07-19 NOTE — Telephone Encounter (Signed)
Patient advised.KW 

## 2017-07-19 NOTE — Telephone Encounter (Signed)
Please advise 

## 2017-07-19 NOTE — Telephone Encounter (Signed)
Diflucan sent to pharmacy.  If she does not improve after taking this, she should be seen for OV.  Erasmo Downer, MD, MPH [facility] 07/19/2017 2:36 PM

## 2017-07-19 NOTE — Telephone Encounter (Signed)
Pt is starting to have a yeast infection and wants something called in for it.  She would like the pills.  She would like something sent today if possible.   [facility]  Call back is [phone]  Thanks teri

## 2017-12-06 ENCOUNTER — Ambulatory Visit: Payer: BLUE CROSS/BLUE SHIELD | Admitting: Family Medicine

## 2017-12-06 ENCOUNTER — Encounter: Payer: Self-pay | Admitting: Family Medicine

## 2017-12-06 VITALS — BP 120/60 | HR 93 | Temp 98.3°F | Wt 170.0 lb

## 2017-12-06 DIAGNOSIS — I83813 Varicose veins of bilateral lower extremities with pain: Secondary | ICD-10-CM

## 2017-12-06 DIAGNOSIS — D500 Iron deficiency anemia secondary to blood loss (chronic): Secondary | ICD-10-CM

## 2017-12-06 DIAGNOSIS — D509 Iron deficiency anemia, unspecified: Secondary | ICD-10-CM | POA: Insufficient documentation

## 2017-12-06 NOTE — Progress Notes (Signed)
Patient: Joan Hunt Female    DOB: 1970-03-16   47 y.o.   MRN: [id number] Visit Date: 12/06/2017  Today's Provider: Shirlee Latch, MD   Chief Complaint  Patient presents with  . Anemia   Subjective:    I, Presley Raddle, CMA, am acting as a scribe for Shirlee Latch, MD.   HPI Anemia:  Patient presents today for a follow up. Last OV 06/03/17. Labs showed iron deficiency. Patient advised to take Ferrous Sulfate 325 mg daily and recheck labs in 6 months. Patient reports compliance with treatment plan.   She is taking Colace daily as well which is helping prevent constipation.  She continues to have regular monthly periods that are moderate in flow.  This is the presumed cause of her iron deficiency anemia.  Patient states that her varicose veins of bilateral legs are becoming more swollen and sore throughout the day.  She has not been using compression stockings.  She wonders if there are any procedures that may be able to help this and resolve them.    Allergies  Allergen Reactions  . Septra [Sulfamethoxazole-Trimethoprim] Anaphylaxis  . Prednisone Other (See Comments)    "feel edgy"     Current Outpatient Medications:  .  calcium carbonate (TUMS - DOSED IN MG ELEMENTAL CALCIUM) 500 MG chewable tablet, Chew 2 tablets by mouth daily., Disp: , Rfl:  .  ferrous sulfate 325 (65 FE) MG tablet, Take 325 mg by mouth daily with breakfast., Disp: , Rfl:  .  XULANE 150-35 MCG/24HR transdermal patch, PLACE 1 PATCH ONTO THE SKIN ONCE A WEEK., Disp: , Rfl: 3  Review of Systems  Constitutional: Negative.   Respiratory: Negative.   Cardiovascular: Negative.   Musculoskeletal: Negative.     Social History   Tobacco Use  . Smoking status: Never Smoker  . Smokeless tobacco: Never Used  Substance Use Topics  . Alcohol use: No   Objective:   BP 120/60 (BP Location: Right Arm, Patient Position: Sitting, Cuff Size: Normal)   Pulse 93   Temp 98.3 F (36.8 C) (Oral)    Wt 170 lb (77.1 kg)   SpO2 99%   BMI 30.60 kg/m  Vitals:   12/06/17 0819  BP: 120/60  Pulse: 93  Temp: 98.3 F (36.8 C)  TempSrc: Oral  SpO2: 99%  Weight: 170 lb (77.1 kg)     Physical Exam  Constitutional: She is oriented to person, place, and time. She appears well-developed and well-nourished. No distress.  HENT:  Head: Normocephalic and atraumatic.  Mouth/Throat: Oropharynx is clear and moist.  Eyes: Conjunctivae are normal. No scleral icterus.  Neck: Neck supple. No thyromegaly present.  Cardiovascular: Normal rate, regular rhythm, normal heart sounds and intact distal pulses.  No murmur heard. Pulmonary/Chest: Effort normal and breath sounds normal. No respiratory distress. She has no wheezes. She has no rales.  Musculoskeletal: She exhibits no edema.  Lymphadenopathy:    She has no cervical adenopathy.  Neurological: She is alert and oriented to person, place, and time.  Skin: Skin is warm and dry. Capillary refill takes less than 2 seconds. No rash noted.  Psychiatric: She has a normal mood and affect. Her behavior is normal.  Vitals reviewed.       Assessment & Plan:   Problem List Items Addressed This Visit      Cardiovascular and Mediastinum   Varicose vein of leg    Becoming more painful I advised again on elevation of legs, compression socks,  and low-sodium diet to avoid edema Referral to vascular so the patient can discuss possible procedures with them      Relevant Orders   Ambulatory referral to Vascular Surgery     Other   Iron deficiency anemia - Primary    Asymptomatic Repeat CBC and iron panel today Continue current iron supplement and stool softener We will adjust as needed based on lab results      Relevant Medications   ferrous sulfate 325 (65 FE) MG tablet   Other Relevant Orders   CBC   Fe+TIBC+Fer       Return in about 6 months (around 06/06/2018) for CPE.   The entirety of the information documented in the History of  Present Illness, Review of Systems and Physical Exam were personally obtained by me. Portions of this information were initially documented by Presley Raddle, CMA and reviewed by me for thoroughness and accuracy.    Erasmo Downer, MD, MPH [facility] 12/06/2017 8:57 AM

## 2017-12-06 NOTE — Assessment & Plan Note (Signed)
Becoming more painful I advised again on elevation of legs, compression socks, and low-sodium diet to avoid edema Referral to vascular so the patient can discuss possible procedures with them

## 2017-12-06 NOTE — Patient Instructions (Signed)

## 2017-12-06 NOTE — Assessment & Plan Note (Signed)
Asymptomatic Repeat CBC and iron panel today Continue current iron supplement and stool softener We will adjust as needed based on lab results

## 2017-12-07 LAB — CBC
HEMATOCRIT: 40.1 % (ref 34.0–46.6)
HEMOGLOBIN: 12.9 g/dL (ref 11.1–15.9)
MCH: 26 pg — AB (ref 26.6–33.0)
MCHC: 32.2 g/dL (ref 31.5–35.7)
MCV: 81 fL (ref 79–97)
Platelets: 522 10*3/uL — ABNORMAL HIGH (ref 150–450)
RBC: 4.96 x10E6/uL (ref 3.77–5.28)
RDW: 14.8 % (ref 12.3–15.4)
WBC: 9.9 10*3/uL (ref 3.4–10.8)

## 2017-12-07 LAB — IRON,TIBC AND FERRITIN PANEL
Ferritin: 21 ng/mL (ref 15–150)
IRON SATURATION: 12 % — AB (ref 15–55)
Iron: 50 ug/dL (ref 27–159)
Total Iron Binding Capacity: 424 ug/dL (ref 250–450)
UIBC: 374 ug/dL (ref 131–425)

## 2017-12-09 ENCOUNTER — Ambulatory Visit: Payer: BLUE CROSS/BLUE SHIELD | Admitting: Family Medicine

## 2017-12-10 ENCOUNTER — Telehealth: Payer: Self-pay

## 2017-12-10 NOTE — Telephone Encounter (Signed)
-----   Message from Margaretann Loveless, PA-C sent at 12/10/2017 12:42 PM EDT ----- Hemoglobin and iron stores continue to improve. Iron still borderline low so would recommend to continue supplement for now.

## 2017-12-10 NOTE — Telephone Encounter (Signed)
Patient advised as directed below. Per patient she wants to know if she should doubled up on the iron but wants this to be run by Dr.B. Patient was advised Dr. B on vacation and she reports that she still wants this to be consulted by Dr. Leonard Schwartz.

## 2017-12-17 ENCOUNTER — Ambulatory Visit: Payer: BLUE CROSS/BLUE SHIELD | Admitting: Family Medicine

## 2017-12-17 ENCOUNTER — Encounter: Payer: Self-pay | Admitting: Family Medicine

## 2017-12-17 VITALS — BP 116/68 | HR 93 | Temp 98.2°F | Wt 170.2 lb

## 2017-12-17 DIAGNOSIS — L259 Unspecified contact dermatitis, unspecified cause: Secondary | ICD-10-CM | POA: Diagnosis not present

## 2017-12-17 MED ORDER — TRIAMCINOLONE ACETONIDE 0.5 % EX OINT: 1.0000 "application " | TOPICAL_OINTMENT | Freq: Two times a day (BID) | CUTANEOUS | 1 refills | Status: DC

## 2017-12-17 MED ORDER — HYDROXYZINE HCL 10 MG PO TABS: 10.0000 mg | ORAL_TABLET | Freq: Three times a day (TID) | ORAL | 0 refills | Status: DC | PRN

## 2017-12-17 NOTE — Patient Instructions (Signed)
Contact Dermatitis Dermatitis is redness, soreness, and swelling (inflammation) of the skin. Contact dermatitis is a reaction to certain substances that touch the skin. You either touched something that irritated your skin, or you have allergies to something you touched. Follow these instructions at home: Skin Care  Moisturize your skin as needed.  Apply cool compresses to the affected areas.  Try taking a bath with: ? Epsom salts. Follow the instructions on the package. You can get these at a pharmacy or grocery store. ? Baking soda. Pour a small amount into the bath as told by your doctor. ? Colloidal oatmeal. Follow the instructions on the package. You can get this at a pharmacy or grocery store.  Try applying baking soda paste to your skin. Stir water into baking soda until it looks like paste.  Do not scratch your skin.  Bathe less often.  Bathe in lukewarm water. Avoid using hot water. Medicines  Take or apply over-the-counter and prescription medicines only as told by your doctor.  If you were prescribed an antibiotic medicine, take or apply your antibiotic as told by your doctor. Do not stop taking the antibiotic even if your condition starts to get better. General instructions  Keep all follow-up visits as told by your doctor. This is important.  Avoid the substance that caused your reaction. If you do not know what caused it, keep a journal to try to track what caused it. Write down: ? What you eat. ? What cosmetic products you use. ? What you drink. ? What you wear in the affected area. This includes jewelry.  If you were given a bandage (dressing), take care of it as told by your doctor. This includes when to change and remove it. Contact a doctor if:  You do not get better with treatment.  Your condition gets worse.  You have signs of infection such as: ? Swelling. ? Tenderness. ? Redness. ? Soreness. ? Warmth.  You have a fever.  You have new  symptoms. Get help right away if:  You have a very bad headache.  You have neck pain.  Your neck is stiff.  You throw up (vomit).  You feel very sleepy.  You see red streaks coming from the affected area.  Your bone or joint underneath the affected area becomes painful after the skin has healed.  The affected area turns darker.  You have trouble breathing. This information is not intended to replace advice given to you by your health care provider. Make sure you discuss any questions you have with your health care provider. Document Released: 12/24/2008 Document Revised: 08/04/2015 Document Reviewed: 07/14/2014 Elsevier Interactive Patient Education  2018 Elsevier Inc[employer]  

## 2017-12-17 NOTE — Progress Notes (Signed)
Patient: Joan Hunt Female    DOB: [birth year]   47 y.o.   MRN: [id number] Visit Date: 12/18/2017  Today's Provider: Shirlee Latch, MD   Chief Complaint  Patient presents with  . Rash   Subjective:    Rash  This is a new problem. The current episode started in the past 7 days. The problem has been gradually worsening since onset. The affected locations include the neck, chest, left arm and right arm. The rash is characterized by dryness, redness and itchiness. She was exposed to nothing. Past treatments include anti-itch cream. The treatment provided mild relief.   Wore different necklace and rash started on neck. She is also worried about whether this could be related to getting a flu shot last week.  She denies any changes in soaps, detergents, lotions, cosmetics, etc.  She hasn't done any yardwork.     Allergies  Allergen Reactions  . Septra [Sulfamethoxazole-Trimethoprim] Anaphylaxis  . Prednisone Other (See Comments)    "feel edgy"     Current Outpatient Medications:  .  calcium carbonate (TUMS - DOSED IN MG ELEMENTAL CALCIUM) 500 MG chewable tablet, Chew 2 tablets by mouth daily., Disp: , Rfl:  .  ferrous sulfate 325 (65 FE) MG tablet, Take 325 mg by mouth daily with breakfast., Disp: , Rfl:  .  XULANE 150-35 MCG/24HR transdermal patch, PLACE 1 PATCH ONTO THE SKIN ONCE A WEEK., Disp: , Rfl: 3 .  hydrOXYzine (ATARAX/VISTARIL) 10 MG tablet, Take 1 tablet (10 mg total) by mouth 3 (three) times daily as needed for itching., Disp: 30 tablet, Rfl: 0 .  triamcinolone ointment (KENALOG) 0.5 %, Apply 1 application topically 2 (two) times daily., Disp: 60 g, Rfl: 1  Review of Systems  Constitutional: Negative.   HENT: Negative.   Eyes: Negative.   Cardiovascular: Negative.   Genitourinary: Negative.   Musculoskeletal: Negative.   Skin: Positive for rash. Negative for color change, pallor and wound.    Social History   Tobacco Use  . Smoking status:  Never Smoker  . Smokeless tobacco: Never Used  Substance Use Topics  . Alcohol use: No   Objective:   BP 116/68 (BP Location: Left Arm, Patient Position: Sitting, Cuff Size: Normal)   Pulse 93   Temp 98.2 F (36.8 C) (Oral)   Wt 170 lb 3.2 oz (77.2 kg)   LMP 12/10/2017   SpO2 97%   BMI 30.63 kg/m  Vitals:   12/17/17 1124  BP: 116/68  Pulse: 93  Temp: 98.2 F (36.8 C)  TempSrc: Oral  SpO2: 97%  Weight: 170 lb 3.2 oz (77.2 kg)     Physical Exam  Constitutional: She is oriented to person, place, and time. She appears well-developed and well-nourished. No distress.  HENT:  Head: Normocephalic and atraumatic.  Eyes: Conjunctivae are normal. No scleral icterus.  Neck: Neck supple. No thyromegaly present.  Cardiovascular: Normal rate, regular rhythm, normal heart sounds and intact distal pulses.  No murmur heard. Pulmonary/Chest: Effort normal and breath sounds normal. No respiratory distress. She has no wheezes. She has no rales.  Musculoskeletal: She exhibits no edema.  Lymphadenopathy:    She has no cervical adenopathy.  Neurological: She is alert and oriented to person, place, and time.  Skin: Skin is warm and dry. Capillary refill takes less than 2 seconds. Rash (erythematous maculopapular, over b/l arms and neck/chest) noted. There is erythema.  Psychiatric: She has a normal mood and affect. Her behavior is normal.  Vitals reviewed.          Assessment & Plan:   1. Contact dermatitis, unspecified contact dermatitis type, unspecified trigger - rash seems consistent with contact dermatits but irritant is unclear - no signs of infection - treat with steroid cream BID - can use hydroxyzine prn itching - discussed return precautions    Meds ordered this encounter  Medications  . triamcinolone ointment (KENALOG) 0.5 %    Sig: Apply 1 application topically 2 (two) times daily.    Dispense:  60 g    Refill:  1  . hydrOXYzine (ATARAX/VISTARIL) 10 MG tablet     Sig: Take 1 tablet (10 mg total) by mouth 3 (three) times daily as needed for itching.    Dispense:  30 tablet    Refill:  0     Return if symptoms worsen or fail to improve.   The entirety of the information documented in the History of Present Illness, Review of Systems and Physical Exam were personally obtained by me. Portions of this information were initially documented by Dara Lords, CMA and reviewed by me for thoroughness and accuracy.    Erasmo Downer, MD, MPH [facility] 12/18/2017 12:42 PM

## 2017-12-17 NOTE — Telephone Encounter (Signed)
I think she is fine on the supplement that she is doing now.  No need to double up.  Iron and hemoglobin have seen good improvement  Erasmo Downer, MD, MPH [facility] 12/17/2017 8:17 AM

## 2017-12-17 NOTE — Telephone Encounter (Signed)
Left message advising pt.  (Per DPR)  Thanks,   -Lizzete Gough  

## 2018-02-14 ENCOUNTER — Ambulatory Visit: Payer: BLUE CROSS/BLUE SHIELD | Admitting: Family Medicine

## 2018-02-14 VITALS — BP 121/79 | HR 100 | Temp 98.4°F | Wt 175.0 lb

## 2018-02-14 DIAGNOSIS — R0982 Postnasal drip: Secondary | ICD-10-CM | POA: Diagnosis not present

## 2018-02-14 DIAGNOSIS — J069 Acute upper respiratory infection, unspecified: Secondary | ICD-10-CM | POA: Diagnosis not present

## 2018-02-14 MED ORDER — FLUTICASONE PROPIONATE 50 MCG/ACT NA SUSP: 2.0000 | Freq: Every day | NASAL | 6 refills | Status: DC

## 2018-02-14 MED ORDER — BENZONATATE 100 MG PO CAPS: 100.0000 mg | ORAL_CAPSULE | Freq: Two times a day (BID) | ORAL | 0 refills | Status: DC | PRN

## 2018-02-14 NOTE — Patient Instructions (Signed)
Upper Respiratory Infection, Adult Most upper respiratory infections (URIs) are caused by a virus. A URI affects the nose, throat, and upper air passages. The most common type of URI is often called "the common cold." Follow these instructions at home:  Take medicines only as told by your doctor.  Gargle warm saltwater or take cough drops to comfort your throat as told by your doctor.  Use a warm mist humidifier or inhale steam from a shower to increase air moisture. This may make it easier to breathe.  Drink enough fluid to keep your pee (urine) clear or pale yellow.  Eat soups and other clear broths.  Have a healthy diet.  Rest as needed.  Go back to work when your fever is gone or your doctor says it is okay. ? You may need to stay home longer to avoid giving your URI to others. ? You can also wear a face mask and wash your hands often to prevent spread of the virus.  Use your inhaler more if you have asthma.  Do not use any tobacco products, including cigarettes, chewing tobacco, or electronic cigarettes. If you need help quitting, ask your doctor. Contact a doctor if:  You are getting worse, not better.  Your symptoms are not helped by medicine.  You have chills.  You are getting more short of breath.  You have brown or red mucus.  You have yellow or brown discharge from your nose.  You have pain in your face, especially when you bend forward.  You have a fever.  You have puffy (swollen) neck glands.  You have pain while swallowing.  You have white areas in the back of your throat. Get help right away if:  You have very bad or constant: ? Headache. ? Ear pain. ? Pain in your forehead, behind your eyes, and over your cheekbones (sinus pain). ? Chest pain.  You have long-lasting (chronic) lung disease and any of the following: ? Wheezing. ? Long-lasting cough. ? Coughing up blood. ? A change in your usual mucus.  You have a stiff neck.  You have  changes in your: ? Vision. ? Hearing. ? Thinking. ? Mood. This information is not intended to replace advice given to you by your health care provider. Make sure you discuss any questions you have with your health care provider. Document Released: 08/15/2007 Document Revised: 10/30/2015 Document Reviewed: 06/03/2013 Elsevier Interactive Patient Education  2018 Elsevier Inc.  

## 2018-02-14 NOTE — Progress Notes (Signed)
Patient: Joan Hunt Female    DOB: 1970/09/23   47 y.o.   MRN: [id number] Visit Date: 02/14/2018  Today's Provider: Shirlee Latch, MD   Chief Complaint  Patient presents with  . URI   Subjective:    URI   Associated symptoms include congestion, coughing, sinus pain, sneezing and a sore throat.  Symptoms started about 1 week ago.  They have improved mostly, but her postnasal drainage, sore throat, cough continues.  She is wanting to make sure that she does not need an antibiotic or other treatment.  Patient has taking Promethazine-Codeine syrup and Delsym and nasal saline and states it has not helped.  She denies fever, abdominal pain, nausea, vomiting, diarrhea, shortness of breath, chest pain     Allergies  Allergen Reactions  . Septra [Sulfamethoxazole-Trimethoprim] Anaphylaxis  . Prednisone Other (See Comments)    "feel edgy"     Current Outpatient Medications:  .  calcium carbonate (TUMS - DOSED IN MG ELEMENTAL CALCIUM) 500 MG chewable tablet, Chew 2 tablets by mouth daily., Disp: , Rfl:  .  ferrous sulfate 325 (65 FE) MG tablet, Take 325 mg by mouth daily with breakfast., Disp: , Rfl:  .  hydrOXYzine (ATARAX/VISTARIL) 10 MG tablet, Take 1 tablet (10 mg total) by mouth 3 (three) times daily as needed for itching., Disp: 30 tablet, Rfl: 0 .  triamcinolone ointment (KENALOG) 0.5 %, Apply 1 application topically 2 (two) times daily., Disp: 60 g, Rfl: 1 .  XULANE 150-35 MCG/24HR transdermal patch, PLACE 1 PATCH ONTO THE SKIN ONCE A WEEK., Disp: , Rfl: 3  Review of Systems  Constitutional: Positive for appetite change.  HENT: Positive for congestion, sinus pressure, sinus pain, sneezing and sore throat.   Respiratory: Positive for cough.   Cardiovascular: Negative.   Musculoskeletal: Negative.   Neurological: Negative.     Social History   Tobacco Use  . Smoking status: Never Smoker  . Smokeless tobacco: Never Used  Substance Use Topics  .  Alcohol use: No   Objective:   BP 121/79 (BP Location: Left Arm, Patient Position: Sitting, Cuff Size: Normal)   Pulse 100   Temp 98.4 F (36.9 C) (Oral)   Wt 175 lb (79.4 kg)   LMP 02/05/2018   SpO2 99%   BMI 31.50 kg/m  Vitals:   02/14/18 1125  BP: 121/79  Pulse: 100  Temp: 98.4 F (36.9 C)  TempSrc: Oral  SpO2: 99%  Weight: 175 lb (79.4 kg)     Physical Exam  Constitutional: She is oriented to person, place, and time. She appears well-developed and well-nourished. No distress.  HENT:  Head: Normocephalic and atraumatic.  Right Ear: Tympanic membrane, external ear and ear canal normal.  Left Ear: Tympanic membrane, external ear and ear canal normal.  Nose: Mucosal edema present. Right sinus exhibits no maxillary sinus tenderness and no frontal sinus tenderness. Left sinus exhibits no maxillary sinus tenderness and no frontal sinus tenderness.  Mouth/Throat: Uvula is midline and mucous membranes are normal. Posterior oropharyngeal erythema present. No oropharyngeal exudate or posterior oropharyngeal edema.  Eyes: Pupils are equal, round, and reactive to light. Conjunctivae and EOM are normal. Right eye exhibits no discharge. Left eye exhibits no discharge. No scleral icterus.  Neck: Neck supple. No thyromegaly present.  Cardiovascular: Normal rate, regular rhythm, normal heart sounds and intact distal pulses.  No murmur heard. Pulmonary/Chest: Effort normal and breath sounds normal. No respiratory distress. She has no wheezes. She has  no rales.  Abdominal: Soft. She exhibits no distension. There is no tenderness.  Musculoskeletal: She exhibits no edema.  Lymphadenopathy:    She has no cervical adenopathy.  Neurological: She is alert and oriented to person, place, and time.  Skin: Skin is warm and dry. Capillary refill takes less than 2 seconds. No rash noted.  Psychiatric: She has a normal mood and affect. Her behavior is normal.  Vitals reviewed.       Assessment &  Plan:   1. Viral URI 2. Postnasal drip - symptoms and exam c/w viral URI - no evidence of strep pharyngitis, CAP, AOM, bacterial sinusitis, or other bacterial infection - suspect postnasal drip is contributing to sore throat and cough - Discussed adding Flonase to help with this -May try Tessalon for cough Discussed the post viral cough can last for several weeks - discussed symptomatic management, natural course, and return precautions     Meds ordered this encounter  Medications  . benzonatate (TESSALON) 100 MG capsule    Sig: Take 1 capsule (100 mg total) by mouth 2 (two) times daily as needed for cough.    Dispense:  20 capsule    Refill:  0  . fluticasone (FLONASE) 50 MCG/ACT nasal spray    Sig: Place 2 sprays into both nostrils daily.    Dispense:  16 g    Refill:  6     Return if symptoms worsen or fail to improve.   The entirety of the information documented in the History of Present Illness, Review of Systems and Physical Exam were personally obtained by me. Portions of this information were initially documented by Drue SecondPortia Carpenter, CMA and reviewed by me for thoroughness and accuracy.    Erasmo DownerBacigalupo, Jakirah M, MD, MPH [facility] 02/14/2018 5:32 PM

## 2018-03-25 DIAGNOSIS — R1032 Left lower quadrant pain: Secondary | ICD-10-CM | POA: Diagnosis not present

## 2018-03-25 DIAGNOSIS — Z01411 Encounter for gynecological examination (general) (routine) with abnormal findings: Secondary | ICD-10-CM | POA: Diagnosis not present

## 2018-04-22 DIAGNOSIS — D252 Subserosal leiomyoma of uterus: Secondary | ICD-10-CM | POA: Diagnosis not present

## 2018-04-22 DIAGNOSIS — D250 Submucous leiomyoma of uterus: Secondary | ICD-10-CM | POA: Diagnosis not present

## 2018-04-22 DIAGNOSIS — R1032 Left lower quadrant pain: Secondary | ICD-10-CM | POA: Diagnosis not present

## 2018-04-22 DIAGNOSIS — D251 Intramural leiomyoma of uterus: Secondary | ICD-10-CM | POA: Diagnosis not present

## 2018-06-06 ENCOUNTER — Encounter: Payer: BLUE CROSS/BLUE SHIELD | Admitting: Family Medicine

## 2018-06-25 ENCOUNTER — Emergency Department: Payer: BLUE CROSS/BLUE SHIELD

## 2018-06-25 ENCOUNTER — Encounter: Payer: Self-pay | Admitting: *Deleted

## 2018-06-25 ENCOUNTER — Other Ambulatory Visit: Payer: Self-pay

## 2018-06-25 ENCOUNTER — Emergency Department
Admission: EM | Admit: 2018-06-25 | Discharge: 2018-06-26 | Disposition: A | Discharge status: Home / Self Care | Payer: BLUE CROSS/BLUE SHIELD | Attending: Emergency Medicine | Admitting: Emergency Medicine

## 2018-06-25 DIAGNOSIS — R1110 Vomiting, unspecified: Secondary | ICD-10-CM | POA: Diagnosis not present

## 2018-06-25 DIAGNOSIS — N3090 Cystitis, unspecified without hematuria: Secondary | ICD-10-CM | POA: Diagnosis not present

## 2018-06-25 DIAGNOSIS — R1031 Right lower quadrant pain: Secondary | ICD-10-CM

## 2018-06-25 DIAGNOSIS — Z79899 Other long term (current) drug therapy: Secondary | ICD-10-CM | POA: Diagnosis not present

## 2018-06-25 LAB — URINALYSIS, COMPLETE (UACMP) WITH MICROSCOPIC
Bilirubin Urine: NEGATIVE
Glucose, UA: NEGATIVE mg/dL
Ketones, ur: NEGATIVE mg/dL
Nitrite: NEGATIVE
Protein, ur: 100 mg/dL — AB
Specific Gravity, Urine: 1.011 (ref 1.005–1.030)
WBC, UA: >50 WBC/hpf — ABNORMAL HIGH (ref 0–5)
pH: 6 (ref 5.0–8.0)

## 2018-06-25 LAB — COMPREHENSIVE METABOLIC PANEL
ALT: 14 U/L (ref 0–44)
AST: 19 U/L (ref 15–41)
Albumin: 4.1 g/dL (ref 3.5–5.0)
Alkaline Phosphatase: 69 U/L (ref 38–126)
Anion gap: 13 (ref 5–15)
BUN: 9 mg/dL (ref 6–20)
CO2: 24 mmol/L (ref 22–32)
Calcium: 9.2 mg/dL (ref 8.9–10.3)
Chloride: 96 mmol/L — ABNORMAL LOW (ref 98–111)
Creatinine, Ser: 0.73 mg/dL (ref 0.44–1.00)
GFR calc Af Amer: >60 mL/min (ref >60)
GFR calc non Af Amer: >60 mL/min (ref >60)
Glucose, Bld: 117 mg/dL — ABNORMAL HIGH (ref 70–99)
Potassium: 4.2 mmol/L (ref 3.5–5.1)
Sodium: 133 mmol/L — ABNORMAL LOW (ref 135–145)
Total Bilirubin: 0.4 mg/dL (ref 0.3–1.2)
Total Protein: 7.8 g/dL (ref 6.5–8.1)

## 2018-06-25 LAB — CBC
HCT: 38.2 % (ref 36.0–46.0)
Hemoglobin: 12.4 g/dL (ref 12.0–15.0)
MCH: 25.6 pg — ABNORMAL LOW (ref 26.0–34.0)
MCHC: 32.5 g/dL (ref 30.0–36.0)
MCV: 78.8 fL — ABNORMAL LOW (ref 80.0–100.0)
Platelets: 540 10*3/uL — ABNORMAL HIGH (ref 150–400)
RBC: 4.85 MIL/uL (ref 3.87–5.11)
RDW: 14.3 % (ref 11.5–15.5)
WBC: 17.5 10*3/uL — ABNORMAL HIGH (ref 4.0–10.5)
nRBC: 0 % (ref 0.0–0.2)

## 2018-06-25 LAB — LIPASE, BLOOD: Lipase: 30 U/L (ref 11–51)

## 2018-06-25 LAB — POCT PREGNANCY, URINE: Preg Test, Ur: NEGATIVE

## 2018-06-25 MED ORDER — SODIUM CHLORIDE 0.9 % IV BOLUS
1000.0000 mL | Freq: Once | INTRAVENOUS | Status: AC
  Administered 2018-06-25: 23:00:00 1000 mL via INTRAVENOUS

## 2018-06-25 MED ORDER — CEPHALEXIN 500 MG PO CAPS: 500.0000 mg | ORAL_CAPSULE | Freq: Two times a day (BID) | ORAL | 0 refills | Status: DC

## 2018-06-25 MED ORDER — KETOROLAC TROMETHAMINE 30 MG/ML IJ SOLN
15.0000 mg | INTRAMUSCULAR | Status: AC
  Administered 2018-06-25: 23:00:00 15 mg via INTRAVENOUS
  Filled 2018-06-25: qty 1

## 2018-06-25 MED ORDER — IOHEXOL 300 MG/ML  SOLN
100.0000 mL | Freq: Once | INTRAMUSCULAR | Status: AC | PRN
  Administered 2018-06-25: 100 mL via INTRAVENOUS

## 2018-06-25 MED ORDER — NAPROXEN 500 MG PO TABS: 500.0000 mg | ORAL_TABLET | Freq: Two times a day (BID) | ORAL | 0 refills | Status: DC

## 2018-06-25 MED ORDER — SODIUM CHLORIDE 0.9 % IV SOLN
1.0000 g | Freq: Once | INTRAVENOUS | Status: AC
  Administered 2018-06-25: 23:00:00 1 g via INTRAVENOUS
  Filled 2018-06-25: qty 10

## 2018-06-25 NOTE — ED Triage Notes (Signed)
Pt ambulatory to triage.  Pt has right side abd pain with nausea and vomiting.  No diarrhea.  Pt alert   Speech clear.

## 2018-06-25 NOTE — ED Provider Notes (Signed)
[facility] Emergency Department Provider Note  ____________________________________________  Time seen: Approximately 10:57 PM  I have reviewed the triage vital signs and the nursing notes.   HISTORY  Chief Complaint Abdominal Pain    HPI Joan Hunt is a 48 y.o. female with a history of iron deficiency anemia who complains of right lower quadrant abdominal pain, worsening for the past 2 days, gradual onset, colicky and waxing and waning.  Associated with some urinary frequency but no dysuria.  No abnormal vaginal bleeding or discharge.  Pain was severe and unbearable earlier today but currently is about 4/10.  Described as dull aching.  Nonradiating.  No aggravating or alleviating factors.  Denies fever.  No history of ovarian cysts or kidney stones.   Able to eat and drink normally, does not affect her pain.   Past Medical History:  Diagnosis Date  . Allergy      Patient Active Problem List   Diagnosis Date Noted  . Iron deficiency anemia 12/06/2017  . Allergic rhinitis 06/03/2017  . 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 Skin tag 06/03/2017  . Shortness of breath on exertion 06/03/2017     Past Surgical History:  Procedure Laterality Date  . NO PAST SURGERIES       Prior to Admission medications   Medication Sig Start Date End Date Taking? Authorizing Provider  benzonatate (TESSALON) 100 MG capsule Take 1 capsule (100 mg total) by mouth 2 (two) times daily as needed for cough. 02/14/18   Bacigalupo, Marzella Schlein, MD  calcium carbonate (TUMS - DOSED IN MG ELEMENTAL CALCIUM) 500 MG chewable tablet Chew 2 tablets by mouth daily.    [provider]  cephALEXin (KEFLEX) 500 MG capsule Take 1 capsule (500 mg total) by mouth 2 (two) times daily. 06/25/18   Sharman Cheek, MD  ferrous sulfate 325 (65 FE) MG tablet Take 325 mg by mouth daily with breakfast.    [provider]  fluticasone (FLONASE) 50 MCG/ACT nasal spray Place 2  sprays into both nostrils daily. 02/14/18   Erasmo Downer, MD  hydrOXYzine (ATARAX/VISTARIL) 10 MG tablet Take 1 tablet (10 mg total) by mouth 3 (three) times daily as needed for itching. 12/17/17   Erasmo Downer, MD  naproxen (NAPROSYN) 500 MG tablet Take 1 tablet (500 mg total) by mouth 2 (two) times daily with a meal. 06/25/18   Sharman Cheek, MD  triamcinolone ointment (KENALOG) 0.5 % Apply 1 application topically 2 (two) times daily. 12/17/17   Bacigalupo, Marzella Schlein, MD  Burr Medico 150-35 MCG/24HR transdermal patch PLACE 1 PATCH ONTO THE SKIN ONCE A WEEK. 06/26/14   [provider]     Allergies Septra [sulfamethoxazole-trimethoprim] and Prednisone   Family History  Problem Relation Age of Onset  . Breast cancer Maternal Aunt   . Diabetes Maternal Aunt   . Allergic rhinitis Mother   . Hyperthyroidism Mother   . Goiter Mother   . Heart murmur Father   . Healthy Sister   . CAD Brother 22       s/p stent placement, smoker  . Heart murmur Daughter   . Sickle cell trait Son   . Emphysema Maternal Grandfather   . Stroke Paternal Grandmother 55  . Healthy Brother   . Stroke Paternal Uncle   . Colon cancer Neg Hx   . Cervical cancer Neg Hx   . Ovarian cancer Neg Hx     Social History Social History   Tobacco Use  .  Smoking status: Never Smoker  . Smokeless tobacco: Never Used  Substance Use Topics  . Alcohol use: No  . Drug use: Never    Review of Systems  Constitutional:   No fever or chills.  ENT:   No sore throat. No rhinorrhea. Cardiovascular:   No chest pain or syncope. Respiratory:   No dyspnea or cough. Gastrointestinal: Positive as above for abdominal pain without vomiting and diarrhea.  Musculoskeletal:   Negative for focal pain or swelling All other systems reviewed and are negative except as documented above in ROS and HPI.  ____________________________________________   PHYSICAL EXAM:  VITAL SIGNS: ED Triage Vitals  Enc Vitals  Group     BP 06/25/18 1939 138/78     Pulse Rate 06/25/18 1939 (!) 103     Resp 06/25/18 1939 20     Temp 06/25/18 1939 97.8 F (36.6 C)     Temp Source 06/25/18 1939 Oral     SpO2 06/25/18 1939 100 %     Weight 06/25/18 1941 170 lb (77.1 kg)     Height 06/25/18 1941 5\' 2"  (1.575 m)     Head Circumference --      Peak Flow --      Pain Score 06/25/18 1940 4     Pain Loc --      Pain Edu? --      Excl. in GC? --     Vital signs reviewed, nursing assessments reviewed.   Constitutional:   Alert and oriented. Non-toxic appearance. Eyes:   Conjunctivae are normal. EOMI. PERRL. ENT      Head:   Normocephalic and atraumatic.      Nose:   No congestion/rhinnorhea.       Mouth/Throat:   MMM, no pharyngeal erythema. No peritonsillar mass.       Neck:   No meningismus. Full ROM. Hematological/Lymphatic/Immunilogical:   No cervical lymphadenopathy. Cardiovascular:   Tachycardia heart rate 105. Symmetric bilateral radial and DP pulses.  No murmurs. Cap refill less than 2 seconds. Respiratory:   Normal respiratory effort without tachypnea/retractions. Breath sounds are clear and equal bilaterally. No wheezes/rales/rhonchi. Gastrointestinal:   Soft with diffuse lower abdominal tenderness, appears worse in suprapubic area but per patient is most pronounced in the right lower quadrant.  No focal tenderness at McBurney's point.. Non distended. There is no CVA tenderness.  No rebound, rigidity, or guarding. Musculoskeletal:   Normal range of motion in all extremities. No joint effusions.  No lower extremity tenderness.  No edema. Neurologic:   Normal speech and language.  Motor grossly intact. No acute focal neurologic deficits are appreciated.  Skin:    Skin is warm, dry and intact. No rash noted.  No petechiae, purpura, or bullae.  ____________________________________________    LABS (pertinent positives/negatives) (all labs ordered are listed, but only abnormal results are displayed) Labs  Reviewed  COMPREHENSIVE METABOLIC PANEL - Abnormal; Notable for the following components:      Result Value   Sodium 133 (*)    Chloride 96 (*)    Glucose, Bld 117 (*)    All other components within normal limits  CBC - Abnormal; Notable for the following components:   WBC 17.5 (*)    MCV 78.8 (*)    MCH 25.6 (*)    Platelets 540 (*)    All other components within normal limits  URINALYSIS, COMPLETE (UACMP) WITH MICROSCOPIC - Abnormal; Notable for the following components:   Color, Urine YELLOW (*)    APPearance  CLOUDY (*)    Hgb urine dipstick MODERATE (*)    Protein, ur 100 (*)    Leukocytes,Ua LARGE (*)    WBC, UA >50 (*)    Bacteria, UA MANY (*)    All other components within normal limits  URINE CULTURE  LIPASE, BLOOD  POC URINE PREG, ED  POCT PREGNANCY, URINE   ____________________________________________   EKG    ____________________________________________    RADIOLOGY  No results found.  ____________________________________________   PROCEDURES Procedures  ____________________________________________  DIFFERENTIAL DIAGNOSIS   Cystitis, appendicitis, ovarian cyst, diverticulitis, renal colic  CLINICAL IMPRESSION / ASSESSMENT AND PLAN / ED COURSE  Medications ordered in the ED: Medications  cefTRIAXone (ROCEPHIN) 1 g in sodium chloride 0.9 % 100 mL IVPB (1 g Intravenous New Bag/Given 06/25/18 2234)  sodium chloride 0.9 % bolus 1,000 mL (1,000 mLs Intravenous New Bag/Given 06/25/18 2231)  ketorolac (TORADOL) 30 MG/ML injection 15 mg (15 mg Intravenous Given 06/25/18 2231)  iohexol (OMNIPAQUE) 300 MG/ML solution 100 mL (100 mLs Intravenous Contrast Given 06/25/18 2252)    Pertinent labs & imaging results that were available during my care of the patient were reviewed by me and considered in my medical decision making (see chart for details).  Argie Amsterdam was evaluated in Emergency Department on 06/25/2018 for the symptoms described in the  history of present illness. She was evaluated in the context of the global COVID-19 pandemic, which necessitated consideration that the patient might be at risk for infection with the SARS-CoV-2 virus that causes COVID-19. Institutional protocols and algorithms that pertain to the evaluation of patients at risk for COVID-19 are in a state of rapid change based on information released by regulatory bodies including the CDC and federal and state organizations. These policies and algorithms were followed during the patient's care in the ED.   Patient presents with lower abdominal pain, worse in the right lower quadrant with tachycardia.  Afebrile.  Labs show a leukocytosis of 17,000 and urinalysis suggestive of cystitis.  Patient given a liter bolus, Toradol 50 mg IV for pain control, ceftriaxone 1 g IV for antibiotic coverage.  Due to the broad differential that may require intervention by general surgery or urology, and obtaining a CT scan to further evaluate.  If unremarkable, patient can be discharged home with management of cystitis with Keflex and NSAIDs.      ____________________________________________   FINAL CLINICAL IMPRESSION(S) / ED DIAGNOSES    Final diagnoses:  Right lower quadrant abdominal pain  Cystitis     ED Discharge Orders         Ordered    naproxen (NAPROSYN) 500 MG tablet  2 times daily with meals     06/25/18 2256    cephALEXin (KEFLEX) 500 MG capsule  2 times daily     06/25/18 2256          Portions of this note were generated with dragon dictation software. Dictation errors may occur despite best attempts at proofreading.   Sharman Cheek, MD 06/25/18 [id number]

## 2018-06-25 NOTE — ED Notes (Signed)
poct pregnancy Negative 

## 2018-06-26 NOTE — ED Notes (Signed)
Pt verbalized understanding of d/c instructions, RX, and f/u care. No further questions at this time. Pt ambulatory to the exit with steady gait  

## 2018-06-28 LAB — URINE CULTURE: Culture: >=100000 — AB

## 2019-06-15 ENCOUNTER — Telehealth: Payer: Self-pay | Admitting: Family Medicine

## 2019-06-15 NOTE — Telephone Encounter (Signed)
Would need to discuss treatment options in light of upcoming vaccine.  Recommend virtual visit to discuss

## 2019-06-15 NOTE — Telephone Encounter (Signed)
Does pt need an office visit for this?  Thanks,   -Jordie Skalsky  

## 2019-06-15 NOTE — Telephone Encounter (Signed)
Pt wants a call back to discuss whether she needs to come in, pt wants more information. Informed of all information below  Best contact [phone]

## 2019-06-15 NOTE — Telephone Encounter (Signed)
Pt has poison ivy or poison oak / Pt has been treating it but it has not helping or clearing up/ Pt has a covid vaccine appt this week and wanted to see if she can get it under control by Thursday. Pt would like advise on an over the counter treatment option or if Dr. Beryle Flock can send in an Rx for her./ please advise

## 2019-06-15 NOTE — Telephone Encounter (Signed)
Left message for pt to scheduled an appointment.  PEC please schedule pt when she calls back.     Thanks,   -Vernona Rieger

## 2019-06-16 NOTE — Telephone Encounter (Signed)
FYI...  I spoke with Joan Hunt she stated the rash has improved significantly over the last 2 days.  She has been treating it with benadryl and OTC cream.  She say the rash is not longer spreading and stopped draining.  I advised her so long as it continues to improve it should be okay to get her vaccine later this week.  I also mention if she develops new or worsening symptoms or rashes that she would need to be seen in the office and would likely have to reschedule her Covid vaccine.  She agreed.   Thanks,   -Vernona Rieger

## 2019-07-14 ENCOUNTER — Other Ambulatory Visit: Payer: Self-pay | Admitting: Obstetrics and Gynecology

## 2019-07-14 DIAGNOSIS — Z1231 Encounter for screening mammogram for malignant neoplasm of breast: Secondary | ICD-10-CM

## 2019-07-14 DIAGNOSIS — Z01419 Encounter for gynecological examination (general) (routine) without abnormal findings: Secondary | ICD-10-CM | POA: Diagnosis not present

## 2019-09-10 ENCOUNTER — Ambulatory Visit
Admission: RE | Admit: 2019-09-10 | Discharge: 2019-09-10 | Disposition: A | Discharge status: Home / Self Care | Payer: BC Managed Care – PPO | Source: Ambulatory Visit | Attending: Obstetrics and Gynecology | Admitting: Obstetrics and Gynecology

## 2019-09-10 DIAGNOSIS — Z1231 Encounter for screening mammogram for malignant neoplasm of breast: Secondary | ICD-10-CM | POA: Diagnosis not present

## 2019-09-16 ENCOUNTER — Other Ambulatory Visit: Payer: Self-pay | Admitting: Obstetrics and Gynecology

## 2019-09-16 DIAGNOSIS — R928 Other abnormal and inconclusive findings on diagnostic imaging of breast: Secondary | ICD-10-CM

## 2019-09-16 DIAGNOSIS — N6489 Other specified disorders of breast: Secondary | ICD-10-CM

## 2019-09-22 ENCOUNTER — Ambulatory Visit
Admission: RE | Admit: 2019-09-22 | Discharge: 2019-09-22 | Disposition: A | Discharge status: Home / Self Care | Payer: BC Managed Care – PPO | Source: Ambulatory Visit | Attending: Obstetrics and Gynecology | Admitting: Obstetrics and Gynecology

## 2019-09-22 DIAGNOSIS — R928 Other abnormal and inconclusive findings on diagnostic imaging of breast: Secondary | ICD-10-CM

## 2019-09-22 DIAGNOSIS — N6489 Other specified disorders of breast: Secondary | ICD-10-CM | POA: Insufficient documentation

## 2019-11-17 DIAGNOSIS — L309 Dermatitis, unspecified: Secondary | ICD-10-CM | POA: Diagnosis not present

## 2020-06-17 ENCOUNTER — Telehealth: Payer: Self-pay

## 2020-06-17 NOTE — Telephone Encounter (Signed)
Copied from CRM [id number]. Topic: Appointment Scheduling - Scheduling Inquiry for Clinic >> Jun 17, 2020  9:05 AM Elliot Gault wrote: Patient states if PCP has any cancellation physical appointments in the am prior to October, please reach out.

## 2020-07-05 ENCOUNTER — Ambulatory Visit (INDEPENDENT_AMBULATORY_CARE_PROVIDER_SITE_OTHER): Payer: No Typology Code available for payment source | Admitting: Family Medicine

## 2020-07-05 ENCOUNTER — Other Ambulatory Visit: Payer: Self-pay

## 2020-07-05 ENCOUNTER — Encounter: Payer: Self-pay | Admitting: Family Medicine

## 2020-07-05 VITALS — BP 115/78 | HR 86 | Temp 98.4°F | Ht 62.5 in | Wt 171.0 lb

## 2020-07-05 DIAGNOSIS — D500 Iron deficiency anemia secondary to blood loss (chronic): Secondary | ICD-10-CM | POA: Diagnosis not present

## 2020-07-05 DIAGNOSIS — Z23 Encounter for immunization: Secondary | ICD-10-CM

## 2020-07-05 DIAGNOSIS — M791 Myalgia, unspecified site: Secondary | ICD-10-CM

## 2020-07-05 DIAGNOSIS — Z0000 Encounter for general adult medical examination without abnormal findings: Secondary | ICD-10-CM

## 2020-07-05 DIAGNOSIS — Z1211 Encounter for screening for malignant neoplasm of colon: Secondary | ICD-10-CM

## 2020-07-05 NOTE — Patient Instructions (Signed)
Health Maintenance, Female Adopting a healthy lifestyle and getting preventive care are important in promoting health and wellness. Ask your health care provider about:  The right schedule for you to have regular tests and exams.  Things you can do on your own to prevent diseases and keep yourself healthy. What should I know about diet, weight, and exercise? Eat a healthy diet  Eat a diet that includes plenty of vegetables, fruits, low-fat dairy products, and lean protein.  Do not eat a lot of foods that are high in solid fats, added sugars, or sodium.   Maintain a healthy weight Body mass index (BMI) is used to identify weight problems. It estimates body fat based on height and weight. Your health care provider can help determine your BMI and help you achieve or maintain a healthy weight. Get regular exercise Get regular exercise. This is one of the most important things you can do for your health. Most adults should:  Exercise for at least 150 minutes each week. The exercise should increase your heart rate and make you sweat (moderate-intensity exercise).  Do strengthening exercises at least twice a week. This is in addition to the moderate-intensity exercise.  Spend less time sitting. Even light physical activity can be beneficial. Watch cholesterol and blood lipids Have your blood tested for lipids and cholesterol at 52 years of age, then have this test every 5 years. Have your cholesterol levels checked more often if:  Your lipid or cholesterol levels are high.  You are older than 52 years of age.  You are at high risk for heart disease. What should I know about cancer screening? Depending on your health history and family history, you may need to have cancer screening at various ages. This may include screening for:  Breast cancer.  Cervical cancer.  Colorectal cancer.  Skin cancer.  Lung cancer. What should I know about heart disease, diabetes, and high blood  pressure? Blood pressure and heart disease  High blood pressure causes heart disease and increases the risk of stroke. This is more likely to develop in people who have high blood pressure readings, are of African descent, or are overweight.  Have your blood pressure checked: ? Every 3-5 years if you are 18-39 years of age. ? Every year if you are 40 years old or older. Diabetes Have regular diabetes screenings. This checks your fasting blood sugar level. Have the screening done:  Once every three years after age 40 if you are at a normal weight and have a low risk for diabetes.  More often and at a younger age if you are overweight or have a high risk for diabetes. What should I know about preventing infection? Hepatitis B If you have a higher risk for hepatitis B, you should be screened for this virus. Talk with your health care provider to find out if you are at risk for hepatitis B infection. Hepatitis C Testing is recommended for:  Everyone born from 1945 through 1965.  Anyone with known risk factors for hepatitis C. Sexually transmitted infections (STIs)  Get screened for STIs, including gonorrhea and chlamydia, if: ? You are sexually active and are younger than 52 years of age. ? You are older than 52 years of age and your health care provider tells you that you are at risk for this type of infection. ? Your sexual activity has changed since you were last screened, and you are at increased risk for chlamydia or gonorrhea. Ask your health care provider   if you are at risk.  Ask your health care provider about whether you are at high risk for HIV. Your health care provider may recommend a prescription medicine to help prevent HIV infection. If you choose to take medicine to prevent HIV, you should first get tested for HIV. You should then be tested every 3 months for as long as you are taking the medicine. Pregnancy  If you are about to stop having your period (premenopausal) and  you may become pregnant, seek counseling before you get pregnant.  Take 400 to 800 micrograms (mcg) of folic acid every day if you become pregnant.  Ask for birth control (contraception) if you want to prevent pregnancy. Osteoporosis and menopause Osteoporosis is a disease in which the bones lose minerals and strength with aging. This can result in bone fractures. If you are 65 years old or older, or if you are at risk for osteoporosis and fractures, ask your health care provider if you should:  Be screened for bone loss.  Take a calcium or vitamin D supplement to lower your risk of fractures.  Be given hormone replacement therapy (HRT) to treat symptoms of menopause. Follow these instructions at home: Lifestyle  Do not use any products that contain nicotine or tobacco, such as cigarettes, e-cigarettes, and chewing tobacco. If you need help quitting, ask your health care provider.  Do not use street drugs.  Do not share needles.  Ask your health care provider for help if you need support or information about quitting drugs. Alcohol use  Do not drink alcohol if: ? Your health care provider tells you not to drink. ? You are pregnant, may be pregnant, or are planning to become pregnant.  If you drink alcohol: ? Limit how much you use to 0-1 drink a day. ? Limit intake if you are breastfeeding.  Be aware of how much alcohol is in your drink. In the [place] one drink equals one 12 oz bottle of beer (355 mL), one 5 oz glass of wine (148 mL), or one 1 oz glass of hard liquor (44 mL). General instructions  Schedule regular health, dental, and eye exams.  Stay current with your vaccines.  Tell your health care provider if: ? You often feel depressed. ? You have ever been abused or do not feel safe at home. Summary  Adopting a healthy lifestyle and getting preventive care are important in promoting health and wellness.  Follow your health care provider's instructions about healthy  diet, exercising, and getting tested or screened for diseases.  Follow your health care provider's instructions on monitoring your cholesterol and blood pressure. This information is not intended to replace advice given to you by your health care provider. Make sure you discuss any questions you have with your health care provider. Document Revised: 02/19/2018 Document Reviewed: 02/19/2018 Elsevier Patient Education  2021 [employer]  

## 2020-07-05 NOTE — Progress Notes (Signed)
Complete physical exam   Patient: Joan Hunt   DOB: [birth year]   50 y.o. Female  MRN: [id number] Visit Date: 07/05/2020  Today's healthcare provider: Laurita Quint Helma Argyle, FNP   Chief Complaint  Patient presents with  . Annual Exam   Subjective    Joan Hunt is a 50 y.o. female who presents today for a complete physical exam.  She reports consuming a general diet. Exercises some.   She generally feels well. She reports sleeping well. She does have additional problems to discuss today.   HPI   Colon cancer screening: never had done  Still having regular periods Still sees GYN Remains on Birth control patch Happy with this method  Dentist: twice a year Eye doctor: yearly  Needs shingles and Tdap   Past Medical History:  Diagnosis Date  . Allergy    Past Surgical History:  Procedure Laterality Date  . NO PAST SURGERIES     Social History   Socioeconomic History  . Marital status: Married    Spouse name: Juanda Crumble A. Rondel Jumbo.  . Number of children: 2  . Years of education: Not on file  . Highest education level: Master's degree (e.g., MA, MS, MEng, MEd, MSW, MBA)  Occupational History  . Occupation: unemployed  Tobacco Use  . Smoking status: Never Smoker  . Smokeless tobacco: Never Used  Vaping Use  . Vaping Use: Never used  Substance and Sexual Activity  . Alcohol use: No  . Drug use: Never  . Sexual activity: Yes    Partners: Male    Birth control/protection: Patch  Other Topics Concern  . Not on file  Social History Narrative  . Not on file   Social Determinants of Health   Financial Resource Strain: Not on file  Food Insecurity: Not on file  Transportation Needs: Not on file  Physical Activity: Not on file  Stress: Not on file  Social Connections: Not on file  Intimate Partner Violence: Not on file   Family Status  Relation Name Status  . Mat Aunt  (Not Specified)  . Mother  Alive  . Father  Alive  . Sister  Alive  .  Brother  Alive  . Daughter  Alive  . Son  Alive  . MGF  (Not Specified)  . PGM  Deceased  . Brother  Alive  . Annamarie Major  (Not Specified)  . Neg Hx  (Not Specified)   Family History  Problem Relation Age of Onset  . Breast cancer Maternal Aunt   . Diabetes Maternal Aunt   . Allergic rhinitis Mother   . Hyperthyroidism Mother   . Goiter Mother   . Heart murmur Father   . Healthy Sister   . CAD Brother 48       s/p stent placement, smoker  . Heart murmur Daughter   . Sickle cell trait Son   . Emphysema Maternal Grandfather   . Stroke Paternal Grandmother 70  . Healthy Brother   . Stroke Paternal Uncle   . Colon cancer Neg Hx   . Cervical cancer Neg Hx   . Ovarian cancer Neg Hx    Allergies  Allergen Reactions  . Septra [Sulfamethoxazole-Trimethoprim] Anaphylaxis  . Prednisone Other (See Comments)    "feel edgy"    Patient Care Team: Virginia Crews, MD as PCP - General (Family Medicine)   Medications: Outpatient Medications Prior to Visit  Medication Sig  . ferrous sulfate 325 (65 FE) MG  tablet Take 325 mg by mouth daily with breakfast.  . Marilu Favre 150-35 MCG/24HR transdermal patch PLACE 1 PATCH ONTO THE SKIN ONCE A WEEK.  . [DISCONTINUED] benzonatate (TESSALON) 100 MG capsule Take 1 capsule (100 mg total) by mouth 2 (two) times daily as needed for cough.  . [DISCONTINUED] calcium carbonate (TUMS - DOSED IN MG ELEMENTAL CALCIUM) 500 MG chewable tablet Chew 2 tablets by mouth daily.  . [DISCONTINUED] cephALEXin (KEFLEX) 500 MG capsule Take 1 capsule (500 mg total) by mouth 2 (two) times daily.  . [DISCONTINUED] fluticasone (FLONASE) 50 MCG/ACT nasal spray Place 2 sprays into both nostrils daily.  . [DISCONTINUED] hydrOXYzine (ATARAX/VISTARIL) 10 MG tablet Take 1 tablet (10 mg total) by mouth 3 (three) times daily as needed for itching.  . [DISCONTINUED] naproxen (NAPROSYN) 500 MG tablet Take 1 tablet (500 mg total) by mouth 2 (two) times daily with a meal.  .  [DISCONTINUED] triamcinolone ointment (KENALOG) 0.5 % Apply 1 application topically 2 (two) times daily.   No facility-administered medications prior to visit.    Review of Systems  Respiratory: Negative.   Cardiovascular: Negative.   Genitourinary: Negative.   Musculoskeletal: Positive for myalgias (bilateral legs).  Neurological: Negative.       Objective    BP 115/78 (BP Location: Left Arm, Patient Position: Sitting, Cuff Size: Large)   Pulse 86   Temp 98.4 F (36.9 C) (Oral)   Ht 5' 2.5" (1.588 m)   Wt 171 lb (77.6 kg)   BMI 30.78 kg/m    Physical Exam Vitals reviewed.  Constitutional:      Appearance: Normal appearance.  HENT:     Right Ear: Tympanic membrane and ear canal normal.     Left Ear: Tympanic membrane and ear canal normal.     Nose: Nose normal.     Mouth/Throat:     Mouth: Mucous membranes are moist.     Pharynx: Oropharynx is clear. No oropharyngeal exudate.  Eyes:     Extraocular Movements: Extraocular movements intact.     Pupils: Pupils are equal, round, and reactive to light.  Neck:     Thyroid: No thyroid mass, thyromegaly or thyroid tenderness.  Cardiovascular:     Rate and Rhythm: Normal rate and regular rhythm.     Pulses: Normal pulses.     Heart sounds: Normal heart sounds.  Pulmonary:     Effort: Pulmonary effort is normal.     Breath sounds: Normal breath sounds.  Abdominal:     General: Bowel sounds are normal.     Palpations: Abdomen is soft.  Musculoskeletal:        General: Normal range of motion.     Cervical back: Normal range of motion.  Lymphadenopathy:     Cervical: No cervical adenopathy.  Skin:    General: Skin is warm and dry.     Capillary Refill: Capillary refill takes less than 2 seconds.  Neurological:     General: No focal deficit present.     Mental Status: She is alert and oriented to person, place, and time.  Psychiatric:        Mood and Affect: Mood normal.        Behavior: Behavior normal.        Last depression screening scores PHQ 2/9 Scores 07/05/2020 07/29/2018 06/03/2017  PHQ - 2 Score 0 0 0  PHQ- 9 Score 0 - -   Last fall risk screening Fall Risk  07/05/2020  Falls in the past year? 0  Number falls in past yr: 0  Injury with Fall? 0  Risk for fall due to : No Fall Risks  Follow up Falls evaluation completed   Last Audit-C alcohol use screening Alcohol Use Disorder Test (AUDIT) 07/05/2020  1. How often do you have a drink containing alcohol? 0  2. How many drinks containing alcohol do you have on a typical day when you are drinking? 0  3. How often do you have six or more drinks on one occasion? 0  AUDIT-C Score 0   A score of 3 or more in women, and 4 or more in men indicates increased risk for alcohol abuse, EXCEPT if all of the points are from question 1   No results found for any visits on 07/05/20.  Assessment & Plan    Routine Health Maintenance and Physical Exam  Exercise Activities and Dietary recommendations Goals   None     Immunization History  Administered Date(s) Administered  . Influenza,inj,Quad PF,6+ Mos 12/16/2016, 12/08/2017  . MMR 10/24/1989  . PFIZER(Purple Top)SARS-COV-2 Vaccination 06/18/2019, 07/09/2019  . Td 10/24/1989  . Tdap 05/15/2010, 07/05/2020    Health Maintenance  Topic Date Due  . HIV Screening  Never done  . COLONOSCOPY (Pts 45-26yr Insurance coverage will need to be confirmed)  Never done  . COVID-19 Vaccine (3 - Pfizer risk 4-dose series) 08/06/2019  . PAP SMEAR-Modifier  03/22/2022 (Originally 03/22/2020)  . INFLUENZA VACCINE  10/10/2020  . MAMMOGRAM  09/09/2021  . TETANUS/TDAP  07/06/2030  . Hepatitis C Screening  Completed  . HPV VACCINES  Aged Out    Discussed health benefits of physical activity, and encouraged her to engage in regular exercise appropriate for her age and condition.  Problem List Items Addressed This Visit      Other   Iron deficiency anemia   Relevant Orders   CBC with Differential    Iron, TIBC and Ferritin Panel    Other Visit Diagnoses    Annual physical exam    -  Primary   Relevant Orders   Comprehensive metabolic panel   Lipid Panel   TSH   Preventative health care       Relevant Orders   Hepatitis C antibody   HIV Antibody (routine testing w rflx)   Special screening for malignant neoplasms, colon       Relevant Orders   Ambulatory referral to Gastroenterology   Encounter for immunization       Relevant Orders   Tdap vaccine greater than or equal to 7yo IM (Completed)   Myalgia       Relevant Orders   Vitamin D, 25-hydroxy     Plan  Will follow up with lab results  Tdap today  Get shingles vaccine at earliest convenience  Referral placed to GI for colon cancer screen.  Return in about 6 months (around 01/04/2021).      [phone](phone) [phone](fax)  [place]

## 2020-07-06 ENCOUNTER — Telehealth: Payer: Self-pay

## 2020-07-06 NOTE — Telephone Encounter (Signed)
Copied from CRM [id number]. Topic: General - Other >> Jul 06, 2020  2:10 PM Jaquita Rector A wrote: Reason for CRM: Patient called in would like to know if Dr B does [place] and asking for Dr Senaida Lange nurse to give her a call to discuss some other medical questions and concerns she have. Can be reached at  Ph# [phone]

## 2020-07-07 LAB — CBC WITH DIFFERENTIAL/PLATELET
Basophils Absolute: 0.1 10*3/uL (ref 0.0–0.2)
Basos: 1 %
EOS (ABSOLUTE): 0.5 10*3/uL — ABNORMAL HIGH (ref 0.0–0.4)
Eos: 5 %
Hematocrit: 41.6 % (ref 34.0–46.6)
Hemoglobin: 13.8 g/dL (ref 11.1–15.9)
Immature Grans (Abs): 0 10*3/uL (ref 0.0–0.1)
Immature Granulocytes: 0 %
Lymphocytes Absolute: 1.9 10*3/uL (ref 0.7–3.1)
Lymphs: 19 %
MCH: 26.7 pg (ref 26.6–33.0)
MCHC: 33.2 g/dL (ref 31.5–35.7)
MCV: 81 fL (ref 79–97)
Monocytes Absolute: 0.6 10*3/uL (ref 0.1–0.9)
Monocytes: 6 %
Neutrophils Absolute: 6.7 10*3/uL (ref 1.4–7.0)
Neutrophils: 69 %
Platelets: 479 10*3/uL — ABNORMAL HIGH (ref 150–450)
RBC: 5.17 x10E6/uL (ref 3.77–5.28)
RDW: 13.4 % (ref 11.7–15.4)
WBC: 9.8 10*3/uL (ref 3.4–10.8)

## 2020-07-07 LAB — COMPREHENSIVE METABOLIC PANEL
ALT: 17 IU/L (ref 0–32)
AST: 17 IU/L (ref 0–40)
Albumin/Globulin Ratio: 1.5 (ref 1.2–2.2)
Albumin: 4.6 g/dL (ref 3.8–4.9)
Alkaline Phosphatase: 77 IU/L (ref 44–121)
BUN/Creatinine Ratio: 8 — ABNORMAL LOW (ref 9–23)
BUN: 7 mg/dL (ref 6–24)
Bilirubin Total: 0.4 mg/dL (ref 0.0–1.2)
CO2: 19 mmol/L — ABNORMAL LOW (ref 20–29)
Calcium: 10 mg/dL (ref 8.7–10.2)
Chloride: 98 mmol/L (ref 96–106)
Creatinine, Ser: 0.84 mg/dL (ref 0.57–1.00)
Globulin, Total: 3.1 g/dL (ref 1.5–4.5)
Glucose: 91 mg/dL (ref 65–99)
Potassium: 4.7 mmol/L (ref 3.5–5.2)
Sodium: 136 mmol/L (ref 134–144)
Total Protein: 7.7 g/dL (ref 6.0–8.5)
eGFR: 84 mL/min/{1.73_m2} (ref >59)

## 2020-07-07 LAB — LIPID PANEL
Chol/HDL Ratio: 5.7 ratio — ABNORMAL HIGH (ref 0.0–4.4)
Cholesterol, Total: 249 mg/dL — ABNORMAL HIGH (ref 100–199)
HDL: 44 mg/dL (ref >39)
LDL Chol Calc (NIH): 166 mg/dL — ABNORMAL HIGH (ref 0–99)
Triglycerides: 209 mg/dL — ABNORMAL HIGH (ref 0–149)
VLDL Cholesterol Cal: 39 mg/dL (ref 5–40)

## 2020-07-07 LAB — HEPATITIS C ANTIBODY: Hep C Virus Ab: <0.1 s/co ratio (ref 0.0–0.9)

## 2020-07-07 LAB — TSH: TSH: 2.31 u[IU]/mL (ref 0.450–4.500)

## 2020-07-07 LAB — HIV ANTIBODY (ROUTINE TESTING W REFLEX): HIV Screen 4th Generation wRfx: NONREACTIVE

## 2020-07-07 LAB — VITAMIN D 25 HYDROXY (VIT D DEFICIENCY, FRACTURES): Vit D, 25-Hydroxy: 38.8 ng/mL (ref 30.0–100.0)

## 2020-07-07 NOTE — Progress Notes (Signed)
Overall labs look good. Your cholesterol is quite elevated. I would recommend starting a medication called a statin specifically crestor for this. Let me know If this is something you are interested in and I can send it to your pharmacy. I would also monitor your diet and increase exercise as able. Let me know if you would like to talk to a dietician to discuss this further.

## 2020-07-07 NOTE — Telephone Encounter (Signed)
LMTCB 07/07/2020.  PEC also please note she has lab results as well.     Thanks,   -Vernona Rieger

## 2020-07-08 NOTE — Telephone Encounter (Signed)
Patient advised.

## 2020-07-19 ENCOUNTER — Telehealth: Payer: Self-pay

## 2020-07-19 NOTE — Telephone Encounter (Signed)
Gastroenterology Pre-Procedure Review  Request Date: Does not scheduled  Requesting Physician:  PATIENT REVIEW QUESTIONS: The patient responded to the following health history questions as indicated:    1. Are you having any GI issues? no 2. Do you have a personal history of Polyps? no 3. Do you have a family history of Colon Cancer or Polyps? no 4. Diabetes Mellitus? no 5. Joint replacements in the past 12 months?no 6. Major health problems in the past 3 months?no 7. Any artificial heart valves, MVP, or defibrillator?no    MEDICATIONS & ALLERGIES:    Patient reports the following regarding taking any anticoagulation/antiplatelet therapy:   Plavix, Coumadin, Eliquis, Xarelto, Lovenox, Pradaxa, Brilinta, or Effient? no Aspirin? no  Patient confirms/reports the following medications:  Current Outpatient Medications  Medication Sig Dispense Refill  . ferrous sulfate 325 (65 FE) MG tablet Take 325 mg by mouth daily with breakfast.    . Burr Medico 150-35 MCG/24HR transdermal patch PLACE 1 PATCH ONTO THE SKIN ONCE A WEEK.  3   No current facility-administered medications for this visit.    Patient confirms/reports the following allergies:  Allergies  Allergen Reactions  . Septra [Sulfamethoxazole-Trimethoprim] Anaphylaxis  . Prednisone Other (See Comments)    "feel edgy"    No orders of the defined types were placed in this encounter.   AUTHORIZATION INFORMATION Primary Insurance: 1D#: Group #:  Secondary Insurance: 1D#: Group #:  SCHEDULE INFORMATION: Date:  Time: Location:

## 2020-09-15 ENCOUNTER — Ambulatory Visit: Payer: Self-pay

## 2020-09-15 NOTE — Telephone Encounter (Signed)
Pt called in stating she just tested positive for Covid on Monday, pt says she would like a call back to see what she can take to feel better and the next options. Please advise.   Pt. States she does not want to speak with triage nurse." Nothing personal, I'd rather speak to Dr. B directly." Please advise pt.

## 2020-09-16 NOTE — Telephone Encounter (Signed)
She does not really have any risk factors that qualify her for oral antiviral treatment or monoclonal antibody treatment.  I would recommend OTC medications for symptom management, such as Delsym for cough, honey for cough, Mucinex for any congestion, Tylenol/ibuprofen for any muscle aches, fever.

## 2020-09-16 NOTE — Telephone Encounter (Signed)
Pt. Given message, verbalizes understanding. Will continue OTC treatments.

## 2020-09-16 NOTE — Telephone Encounter (Signed)
Lmtcb okay for [facility] nurse triage to advise patient of doctors message below. KW

## 2020-09-23 ENCOUNTER — Telehealth: Payer: Self-pay

## 2020-09-23 NOTE — Telephone Encounter (Signed)
Copied from CRM [id number]. Topic: General - Inquiry >> Sep 23, 2020 10:15 AM Joan Hunt D wrote: Reason for CRM: Pt called saying she was diagnosed for [facility] July 4th.  She said she is just now feeling better.  She said her son has college orientation next week and she wants to know if it is safe for her to go  CB#  [phone]

## 2020-09-23 NOTE — Telephone Encounter (Signed)
I has been over 10 days since positive test. If no fever in the past 7 hrs (without taking any Tylenol or Ibuprofen), she should not be contagious to any one. Using a mask can still be the most protective measure.

## 2020-09-26 NOTE — Telephone Encounter (Signed)
Advised 

## 2020-10-19 ENCOUNTER — Ambulatory Visit: Payer: Self-pay | Admitting: *Deleted

## 2020-10-19 NOTE — Telephone Encounter (Signed)
Questions regarding Covid Boosters, positive covid results and covid antibody protection. Answered questions and referred to the [facility] website for further up to date information.Answer Assessment - Initial Assessment Questions 1. SYMPTOMS: "What is the main symptom?" (e.g., redness, swelling, pain)      none 2. ONSET: "When was the vaccine (shot) given?" "How much later did the sxs begin?" (e.g., hours, days ago)      na 3. SEVERITY: "How bad is it?"      na 4. FEVER: "Is there a fever?" If Yes, ask: "What is it, how was it measured, and when did it start?"      na 5. IMMUNIZATIONS GIVEN: "What shots have you recently received?"     all 6. PAST REACTIONS: "Have you reacted to immunizations before?" If Yes, ask: "What happened?"     no 7. OTHER SYMPTOMS: "Do you have any other symptoms?"     None at this time  Protocols used: Immunization Reactions-A-AH

## 2020-11-08 ENCOUNTER — Other Ambulatory Visit: Payer: Self-pay | Admitting: Obstetrics and Gynecology

## 2020-11-08 DIAGNOSIS — Z1231 Encounter for screening mammogram for malignant neoplasm of breast: Secondary | ICD-10-CM

## 2020-11-08 LAB — HM PAP SMEAR: HM Pap smear: NEGATIVE

## 2020-11-24 ENCOUNTER — Ambulatory Visit
Admission: RE | Admit: 2020-11-24 | Discharge: 2020-11-24 | Disposition: A | Discharge status: Home / Self Care | Payer: BC Managed Care – PPO | Source: Ambulatory Visit | Attending: Obstetrics and Gynecology | Admitting: Obstetrics and Gynecology

## 2020-11-24 ENCOUNTER — Other Ambulatory Visit: Payer: Self-pay

## 2020-11-24 DIAGNOSIS — Z1231 Encounter for screening mammogram for malignant neoplasm of breast: Secondary | ICD-10-CM | POA: Diagnosis not present

## 2020-12-15 ENCOUNTER — Telehealth: Payer: Self-pay

## 2020-12-15 NOTE — Telephone Encounter (Signed)
Have called and spoke with the patient.  Answered her questions concerning both vaccines.  She states she still "just wants to talk to Dr B about it more".  She is aware that Dr B is out until next week    Copied from CRM [id number]. Topic: General - Other >> Dec 14, 2020 12:53 PM Leafy Ro wrote: Reason for CRM: Pt is calling and would like dr b to call her back concerning shingles and new covid vaccine. Pt would like dr b to call her before 12 noon. Pt is aware md is out of the office this week

## 2020-12-19 NOTE — Telephone Encounter (Signed)
LMTCB

## 2020-12-19 NOTE — Telephone Encounter (Signed)
Does she want to know about timing of shingrix and covid booster or is she worried about getting shingles from the covid booster.  Need more information.  If it is about timing, I'd recommend spreading the 2 different vaccines by 2 weeks.

## 2020-12-20 NOTE — Telephone Encounter (Signed)
lmtcb

## 2020-12-23 NOTE — Telephone Encounter (Signed)
lmtcb

## 2021-01-05 ENCOUNTER — Encounter: Payer: BLUE CROSS/BLUE SHIELD | Admitting: Family Medicine

## 2021-06-12 ENCOUNTER — Ambulatory Visit: Payer: BC Managed Care – PPO | Admitting: Family Medicine

## 2021-06-12 ENCOUNTER — Encounter: Payer: Self-pay | Admitting: Family Medicine

## 2021-06-12 VITALS — BP 131/80 | HR 85 | Temp 98.3°F | Resp 16 | Wt 170.8 lb

## 2021-06-12 DIAGNOSIS — S56911A Strain of unspecified muscles, fascia and tendons at forearm level, right arm, initial encounter: Secondary | ICD-10-CM | POA: Diagnosis not present

## 2021-06-12 NOTE — Progress Notes (Signed)
?  ? ?I,Sulibeya S Dimas,acting as a scribe for Shirlee Latch, MD.,have documented all relevant documentation on the behalf of Shirlee Latch, MD,as directed by  Shirlee Latch, MD while in the presence of Shirlee Latch, MD. ? ? ?Established patient visit ? ? ?Patient: Joan Hunt   DOB: 11-15-1970   51 y.o. Female  MRN: [id number] ?Visit Date: 06/12/2021 ? ?Today's healthcare provider: Shirlee Latch, MD  ? ?Chief Complaint  ?Patient presents with  ? Arm Pain  ? ?Subjective  ?  ?Arm Pain  ?There was no injury mechanism. The pain is present in the right forearm. The quality of the pain is described as aching. The pain does not radiate. The pain is mild. The pain has been Fluctuating since the incident. Pertinent negatives include no chest pain, muscle weakness, numbness or tingling. The symptoms are aggravated by movement and lifting. Treatments tried: CBD muscle cream. The treatment provided mild relief.   ? ?Went back to work full time and using a computer more now.   ?R hand dominant ?Worse with gripping and lifting  ?Muscle cream helps a little, but is temporary ? ?Medications: ?Outpatient Medications Prior to Visit  ?Medication Sig  ? XULANE 150-35 MCG/24HR transdermal patch PLACE 1 PATCH ONTO THE SKIN ONCE A WEEK.  ? ferrous sulfate 325 (65 FE) MG tablet Take 325 mg by mouth daily with breakfast. (Patient not taking: Reported on 06/12/2021)  ? ?No facility-administered medications prior to visit.  ? ? ?Review of Systems  ?Constitutional:  Negative for appetite change and fever.  ?Respiratory:  Negative for chest tightness.   ?Cardiovascular:  Negative for chest pain and palpitations.  ?Musculoskeletal:  Positive for myalgias.  ?Neurological:  Negative for tingling and numbness.  ? ?  ?  Objective  ?  ?BP 131/80 (BP Location: Left Arm, Patient Position: Sitting, Cuff Size: Large)   Pulse 85   Temp 98.3 ?F (36.8 ?C) (Temporal)   Resp 16   Wt 170 lb 12.8 oz (77.5 kg)   BMI 30.74 kg/m?   ?BP Readings from Last 3 Encounters:  ?06/12/21 131/80  ?07/05/20 115/78  ?06/25/18 119/82  ? ?Wt Readings from Last 3 Encounters:  ?06/12/21 170 lb 12.8 oz (77.5 kg)  ?07/05/20 171 lb (77.6 kg)  ?06/25/18 170 lb (77.1 kg)  ? ?  ? ?Physical Exam ?Vitals reviewed.  ?Constitutional:   ?   General: She is not in acute distress. ?   Appearance: She is well-developed.  ?HENT:  ?   Head: Normocephalic and atraumatic.  ?Eyes:  ?   General: No scleral icterus. ?   Conjunctiva/sclera: Conjunctivae normal.  ?Cardiovascular:  ?   Rate and Rhythm: Normal rate and regular rhythm.  ?Pulmonary:  ?   Effort: Pulmonary effort is normal. No respiratory distress.  ?Musculoskeletal:  ?   Comments: R arm: Full wrist and elbow ROM.  No bony tenderness. No swelling or deformity. Pain with resisted wrist extension and supination along supinator muscle.  ?Skin: ?   General: Skin is warm and dry.  ?   Findings: No rash.  ?Neurological:  ?   Mental Status: She is alert and oriented to person, place, and time.  ?Psychiatric:     ?   Behavior: Behavior normal.  ?  ? ? ?No results found for any visits on 06/12/21. ? Assessment & Plan  ? ?1. Muscle strain of right forearm, initial encounter ?- new problem  ?-History and exam is consistent with muscle strain of the right  supinator muscle ?- Discussed rest, ice ?- Do not think bracing would help as she does not have any signs of tennis elbow ?- Home exercise program given ?- Voltaren gel as needed ?- Return precautions discussed-consider physical therapy if not improving ? ?Return for CPE, as scheduled.  ?   ? ?I, Lavon Paganini, MD, have reviewed all documentation for this visit. The documentation on 06/12/21 for the exam, diagnosis, procedures, and orders are all accurate and complete. ? ? ?Virginia Crews, MD, MPH ?[place] ?[facility]   ?

## 2021-07-06 ENCOUNTER — Encounter: Payer: No Typology Code available for payment source | Admitting: Family Medicine

## 2021-07-10 ENCOUNTER — Ambulatory Visit (INDEPENDENT_AMBULATORY_CARE_PROVIDER_SITE_OTHER): Payer: BC Managed Care – PPO | Admitting: Family Medicine

## 2021-07-10 ENCOUNTER — Encounter: Payer: Self-pay | Admitting: Family Medicine

## 2021-07-10 VITALS — BP 113/76 | HR 103 | Temp 98.0°F | Resp 16 | Ht 62.5 in | Wt 170.4 lb

## 2021-07-10 DIAGNOSIS — Z0000 Encounter for general adult medical examination without abnormal findings: Secondary | ICD-10-CM

## 2021-07-10 DIAGNOSIS — E782 Mixed hyperlipidemia: Secondary | ICD-10-CM | POA: Insufficient documentation

## 2021-07-10 DIAGNOSIS — E669 Obesity, unspecified: Secondary | ICD-10-CM | POA: Insufficient documentation

## 2021-07-10 DIAGNOSIS — Z1211 Encounter for screening for malignant neoplasm of colon: Secondary | ICD-10-CM | POA: Diagnosis not present

## 2021-07-10 DIAGNOSIS — D500 Iron deficiency anemia secondary to blood loss (chronic): Secondary | ICD-10-CM | POA: Diagnosis not present

## 2021-07-10 DIAGNOSIS — Z6830 Body mass index (BMI) 30.0-30.9, adult: Secondary | ICD-10-CM

## 2021-07-10 NOTE — Assessment & Plan Note (Signed)
Reviewed last lipid panel Not currently on a statin Recheck FLP and CMP Discussed diet and exercise  

## 2021-07-10 NOTE — Assessment & Plan Note (Signed)
Discussed importance of healthy weight management Discussed diet and exercise  

## 2021-07-10 NOTE — Patient Instructions (Signed)
   The CDC recommends two doses of Shingrix (the shingles vaccine) separated by 2 to 6 months for adults age 53 years and older. I recommend checking with your insurance plan regarding coverage for this vaccine.   

## 2021-07-10 NOTE — Progress Notes (Signed)
? ? ?I,Joseline E Rosas,acting as a scribe for Joan Paganini, MD.,have documented all relevant documentation on the behalf of Joan Paganini, MD,as directed by  Joan Paganini, MD while in the presence of Joan Paganini, MD.  ? ?Complete physical exam ? ? ?Patient: Joan Hunt   DOB: [birth year]   51 y.o. Female  MRN: [id number] ?Visit Date: 07/10/2021 ? ?Today's healthcare provider: Lavon Paganini, MD  ? ?Chief Complaint  ?Patient presents with  ? Annual Exam  ? ?Subjective  ?  ?Joan Hunt is a 51 y.o. female who presents today for a complete physical exam.  ?She reports consuming a general diet. Home exercise routine includes walking. She generally feels well. She reports sleeping well. She does not have additional problems to discuss today.  ?HPI  ? ? ?Past Medical History:  ?Diagnosis Date  ? Allergy   ? ?Past Surgical History:  ?Procedure Laterality Date  ? NO PAST SURGERIES    ? ?Social History  ? ?Socioeconomic History  ? Marital status: Married  ?  Spouse name: Clenton Pare. Rondel Jumbo.  ? Number of children: 2  ? Years of education: Not on file  ? Highest education level: Master's degree (e.g., MA, MS, MEng, MEd, MSW, MBA)  ?Occupational History  ? Occupation: unemployed  ?Tobacco Use  ? Smoking status: Never  ? Smokeless tobacco: Never  ?Vaping Use  ? Vaping Use: Never used  ?Substance and Sexual Activity  ? Alcohol use: No  ? Drug use: Never  ? Sexual activity: Yes  ?  Partners: Male  ?  Birth control/protection: Patch  ?Other Topics Concern  ? Not on file  ?Social History Narrative  ? Not on file  ? ?Social Determinants of Health  ? ?Financial Resource Strain: Not on file  ?Food Insecurity: Not on file  ?Transportation Needs: Not on file  ?Physical Activity: Not on file  ?Stress: Not on file  ?Social Connections: Not on file  ?Intimate Partner Violence: Not on file  ? ?Family Status  ?Relation Name Status  ? Mat Aunt  (Not Specified)  ? Mother  Alive  ? Father  Alive  ? Sister   Alive  ? Brother  Alive  ? Daughter  Alive  ? Son  Alive  ? MGF  (Not Specified)  ? PGM  Deceased  ? Brother  Alive  ? Annamarie Major  (Not Specified)  ? Neg Hx  (Not Specified)  ? ?Family History  ?Problem Relation Age of Onset  ? Breast cancer Maternal Aunt   ? Diabetes Maternal Aunt   ? Allergic rhinitis Mother   ? Hyperthyroidism Mother   ? Goiter Mother   ? Heart murmur Father   ? Healthy Sister   ? CAD Brother 10  ?     s/p stent placement, smoker  ? Heart murmur Daughter   ? Sickle cell trait Son   ? Emphysema Maternal Grandfather   ? Stroke Paternal Grandmother 32  ? Healthy Brother   ? Stroke Paternal Uncle   ? Colon cancer Neg Hx   ? Cervical cancer Neg Hx   ? Ovarian cancer Neg Hx   ? ?Allergies  ?Allergen Reactions  ? Septra [Sulfamethoxazole-Trimethoprim] Anaphylaxis  ? Prednisone Other (See Comments)  ?  "feel edgy"  ?  ?Patient Care Team: ?Virginia Crews, MD as PCP - General (Family Medicine)  ? ?Medications: ?Outpatient Medications Prior to Visit  ?Medication Sig  ? XULANE 150-35 MCG/24HR transdermal patch PLACE 1 PATCH ONTO  THE SKIN ONCE A WEEK.  ? ?No facility-administered medications prior to visit.  ? ? ?Review of Systems  ?Constitutional: Negative.   ?HENT: Negative.    ?Eyes: Negative.   ?Respiratory: Negative.    ?Cardiovascular: Negative.   ?Gastrointestinal: Negative.   ?Endocrine: Negative.   ?Genitourinary: Negative.   ?Musculoskeletal: Negative.   ?Skin: Negative.   ?Allergic/Immunologic: Positive for environmental allergies.  ?Neurological: Negative.   ?Hematological: Negative.   ?Psychiatric/Behavioral: Negative.    ?All other systems reviewed and are negative. ? ? ? Objective  ? ?  ?BP 113/76 (BP Location: Left Arm, Patient Position: Sitting, Cuff Size: Normal)   Pulse (!) 103   Temp 98 ?F (36.7 ?C) (Oral)   Resp 16   Ht 5' 2.5" (1.588 m)   Wt 170 lb 6.4 oz (77.3 kg)   BMI 30.67 kg/m?  ? ? ? ?Physical Exam ?Vitals reviewed.  ?Constitutional:   ?   General: She is not in acute  distress. ?   Appearance: Normal appearance. She is well-developed. She is not diaphoretic.  ?HENT:  ?   Head: Normocephalic and atraumatic.  ?   Right Ear: Tympanic membrane, ear canal and external ear normal.  ?   Left Ear: Tympanic membrane, ear canal and external ear normal.  ?   Nose: Nose normal.  ?   Mouth/Throat:  ?   Mouth: Mucous membranes are moist.  ?   Pharynx: Oropharynx is clear. No oropharyngeal exudate.  ?Eyes:  ?   General: No scleral icterus. ?   Conjunctiva/sclera: Conjunctivae normal.  ?   Pupils: Pupils are equal, round, and reactive to light.  ?Neck:  ?   Thyroid: No thyromegaly.  ?Cardiovascular:  ?   Rate and Rhythm: Normal rate and regular rhythm.  ?   Pulses: Normal pulses.  ?   Heart sounds: Normal heart sounds. No murmur heard. ?Pulmonary:  ?   Effort: Pulmonary effort is normal. No respiratory distress.  ?   Breath sounds: Normal breath sounds. No wheezing or rales.  ?Abdominal:  ?   General: There is no distension.  ?   Palpations: Abdomen is soft.  ?   Tenderness: There is no abdominal tenderness.  ?Musculoskeletal:     ?   General: No deformity.  ?   Cervical back: Neck supple.  ?   Right lower leg: No edema.  ?   Left lower leg: No edema.  ?Lymphadenopathy:  ?   Cervical: No cervical adenopathy.  ?Skin: ?   General: Skin is warm and dry.  ?   Findings: No rash.  ?Neurological:  ?   Mental Status: She is alert and oriented to person, place, and time. Mental status is at baseline.  ?   Gait: Gait normal.  ?Psychiatric:     ?   Mood and Affect: Mood normal.     ?   Behavior: Behavior normal.     ?   Thought Content: Thought content normal.  ?  ? ? ?Last depression screening scores ? ?  06/12/2021  ? 10:05 AM 07/05/2020  ?  1:29 PM 07/29/2018  ?  3:44 PM  ?PHQ 2/9 Scores  ?PHQ - 2 Score 0 0 0  ?PHQ- 9 Score 2 0   ? ?Last fall risk screening ? ?  06/12/2021  ? 10:06 AM  ?Fall Risk   ?Falls in the past year? 0  ?Number falls in past yr: 0  ?Injury with Fall? 0  ?Risk for fall due  to : No Fall  Risks  ?Follow up Falls evaluation completed  ? ?Last Audit-C alcohol use screening ? ?  06/12/2021  ? 10:05 AM  ?Alcohol Use Disorder Test (AUDIT)  ?1. How often do you have a drink containing alcohol? 0  ?2. How many drinks containing alcohol do you have on a typical day when you are drinking? 0  ?3. How often do you have six or more drinks on one occasion? 0  ?AUDIT-C Score 0  ? ?A score of 3 or more in women, and 4 or more in men indicates increased risk for alcohol abuse, EXCEPT if all of the points are from question 1  ? ?Results for orders placed or performed in visit on 07/10/21  ?HM PAP SMEAR  ?Result Value Ref Range  ? HM Pap smear NL,PAP-neg   ? ? Assessment & Plan  ?  ?Routine Health Maintenance and Physical Exam ? ?Exercise Activities and Dietary recommendations ? Goals   ?None ?  ? ? ?Immunization History  ?Administered Date(s) Administered  ? Influenza,inj,Quad PF,6+ Mos 12/16/2016, 12/08/2017  ? MMR 10/24/1989  ? PFIZER(Purple Top)SARS-COV-2 Vaccination 06/18/2019, 07/09/2019, 02/28/2020  ? Td 10/24/1989  ? Tdap 05/15/2010, 07/05/2020  ? ? ?Health Maintenance  ?Topic Date Due  ? Zoster Vaccines- Shingrix (1 of 2) Never done  ? COLONOSCOPY (Pts 45-26yr Insurance coverage will need to be confirmed)  Never done  ? COVID-19 Vaccine (4 - Booster for Pfizer series) 04/24/2020  ? INFLUENZA VACCINE  10/10/2021  ? MAMMOGRAM  11/25/2022  ? PAP SMEAR-Modifier  11/09/2023  ? TETANUS/TDAP  07/06/2030  ? Hepatitis C Screening  Completed  ? HIV Screening  Completed  ? HPV VACCINES  Aged Out  ? ? ?Discussed health benefits of physical activity, and encouraged her to engage in regular exercise appropriate for her age and condition. ? ?Problem List Items Addressed This Visit   ? ?  ? Other  ? Iron deficiency anemia  ?  Asymptomatic ?Well controlled on last panel ?Recheck CBC ? ?  ?  ? Relevant Orders  ? CBC  ? Moderate mixed hyperlipidemia not requiring statin therapy  ?  Reviewed last lipid panel ?Not currently on a  statin ?Recheck FLP and CMP ?Discussed diet and exercise  ?  ?  ? Relevant Orders  ? Lipid panel  ? Comprehensive metabolic panel  ? Class 1 obesity without serious comorbidity with body mass index (BMI) of 3

## 2021-07-10 NOTE — Assessment & Plan Note (Signed)
Asymptomatic ?Well controlled on last panel ?Recheck CBC ?

## 2021-07-11 ENCOUNTER — Telehealth: Payer: Self-pay

## 2021-07-11 NOTE — Telephone Encounter (Signed)
CALLED PATIENT NO ANSWER LEFT VOICEMAIL FOR A CALL BACK °Letter sent °

## 2021-08-25 NOTE — Progress Notes (Unsigned)
     I,Sulibeya S Dimas,acting as a Neurosurgeon for Shirlee Latch, MD.,have documented all relevant documentation on the behalf of Shirlee Latch, MD,as directed by  Shirlee Latch, MD while in the presence of Shirlee Latch, MD.   Established patient visit   Patient: Joan Hunt   DOB: 1971-01-30   51 y.o. Female  MRN: [id number] Visit Date: 08/28/2021  Today's healthcare provider: Shirlee Latch, MD   Chief Complaint  Patient presents with   Hemorrhoids   Subjective    HPI  Hemorrhoids: Patient complains of evaluation of possible hemorrhoids. Onset of symptoms was gradual starting a few week ago ago with gradually improving course since that time.  She describes symptoms as painful defecation. Treatment to date has been OTC creams: effective. Patient denies  any other discomfort .   Never had hemorrhoids before. No bleeding.  Has had a lot of stress recently.  Eating a lot of fiber and drinking a lot of water.  No constipation.       Medications: Outpatient Medications Prior to Visit  Medication Sig   XULANE 150-35 MCG/24HR transdermal patch PLACE 1 PATCH ONTO THE SKIN ONCE A WEEK.   No facility-administered medications prior to visit.    Review of Systems  Constitutional:  Negative for appetite change and unexpected weight change.  Respiratory:  Negative for shortness of breath.   Cardiovascular:  Negative for chest pain and palpitations.  Gastrointestinal:  Positive for rectal pain. Negative for abdominal pain, anal bleeding, blood in stool, constipation and diarrhea.       Objective    BP 117/74 (BP Location: Left Arm, Patient Position: Sitting, Cuff Size: Large)   Pulse (!) 106   Temp 97.8 F (36.6 C) (Oral)   Resp 16   Wt 171 lb 9.6 oz (77.8 kg)   LMP 08/08/2021 (Approximate)   BMI 30.89 kg/m  BP Readings from Last 3 Encounters:  08/28/21 117/74  07/10/21 113/76  06/12/21 131/80   Wt Readings from Last 3 Encounters:  08/28/21 171 lb  9.6 oz (77.8 kg)  07/10/21 170 lb 6.4 oz (77.3 kg)  06/12/21 170 lb 12.8 oz (77.5 kg)      Physical Exam Constitutional:      General: She is not in acute distress.    Appearance: Normal appearance.  HENT:     Head: Normocephalic.  Pulmonary:     Effort: Pulmonary effort is normal. No respiratory distress.  Genitourinary:    Comments: Small external hemorrhoids - none thrombosed No fissures Neurological:     Mental Status: She is alert and oriented to person, place, and time. Mental status is at baseline.       No results found for any visits on 08/28/21.  Assessment & Plan     1. External hemorrhoids - very small hemorrhoids without bleeding or thrombosis - continue OTC creams/wipes prn - sitz bath prn, do not sit on toilet for long periods of time, avoid constipation   Return if symptoms worsen or fail to improve.      I, Shirlee Latch, MD, have reviewed all documentation for this visit. The documentation on 08/28/21 for the exam, diagnosis, procedures, and orders are all accurate and complete.   Bacigalupo, Marzella Schlein, MD, MPH [facility]

## 2021-08-28 ENCOUNTER — Encounter: Payer: Self-pay | Admitting: Family Medicine

## 2021-08-28 ENCOUNTER — Ambulatory Visit: Payer: BC Managed Care – PPO | Admitting: Family Medicine

## 2021-08-28 VITALS — BP 117/74 | HR 106 | Temp 97.8°F | Resp 16 | Wt 171.6 lb

## 2021-08-28 DIAGNOSIS — K644 Residual hemorrhoidal skin tags: Secondary | ICD-10-CM | POA: Diagnosis not present

## 2021-09-02 LAB — COMPREHENSIVE METABOLIC PANEL
ALT: 13 IU/L (ref 0–32)
AST: 17 IU/L (ref 0–40)
Albumin/Globulin Ratio: 1.4 (ref 1.2–2.2)
Albumin: 4.3 g/dL (ref 3.8–4.9)
Alkaline Phosphatase: 67 IU/L (ref 44–121)
BUN/Creatinine Ratio: 12 (ref 9–23)
BUN: 11 mg/dL (ref 6–24)
Bilirubin Total: 0.3 mg/dL (ref 0.0–1.2)
CO2: 20 mmol/L (ref 20–29)
Calcium: 9.6 mg/dL (ref 8.7–10.2)
Chloride: 100 mmol/L (ref 96–106)
Creatinine, Ser: 0.93 mg/dL (ref 0.57–1.00)
Globulin, Total: 3.1 g/dL (ref 1.5–4.5)
Glucose: 94 mg/dL (ref 70–99)
Potassium: 4.2 mmol/L (ref 3.5–5.2)
Sodium: 137 mmol/L (ref 134–144)
Total Protein: 7.4 g/dL (ref 6.0–8.5)
eGFR: 73 mL/min/{1.73_m2} (ref >59)

## 2021-09-02 LAB — CBC
Hematocrit: 35.9 % (ref 34.0–46.6)
Hemoglobin: 11.6 g/dL (ref 11.1–15.9)
MCH: 24.2 pg — ABNORMAL LOW (ref 26.6–33.0)
MCHC: 32.3 g/dL (ref 31.5–35.7)
MCV: 75 fL — ABNORMAL LOW (ref 79–97)
Platelets: 565 10*3/uL — ABNORMAL HIGH (ref 150–450)
RBC: 4.79 x10E6/uL (ref 3.77–5.28)
RDW: 14.5 % (ref 11.7–15.4)
WBC: 9.5 10*3/uL (ref 3.4–10.8)

## 2021-09-02 LAB — LIPID PANEL
Chol/HDL Ratio: 4.7 ratio — ABNORMAL HIGH (ref 0.0–4.4)
Cholesterol, Total: 221 mg/dL — ABNORMAL HIGH (ref 100–199)
HDL: 47 mg/dL (ref >39)
LDL Chol Calc (NIH): 138 mg/dL — ABNORMAL HIGH (ref 0–99)
Triglycerides: 201 mg/dL — ABNORMAL HIGH (ref 0–149)
VLDL Cholesterol Cal: 36 mg/dL (ref 5–40)

## 2021-09-21 ENCOUNTER — Encounter: Payer: BC Managed Care – PPO | Admitting: Family Medicine

## 2021-10-04 ENCOUNTER — Telehealth: Payer: Self-pay

## 2021-10-04 DIAGNOSIS — Z1211 Encounter for screening for malignant neoplasm of colon: Secondary | ICD-10-CM

## 2021-10-04 MED ORDER — NA SULFATE-K SULFATE-MG SULF 17.5-3.13-1.6 GM/177ML PO SOLN: 1.0000 | Freq: Once | ORAL | 0 refills | Status: AC

## 2021-10-04 NOTE — Telephone Encounter (Signed)
Gastroenterology Pre-Procedure Review  Request Date: Mar 02, 2022 Colonoscopy.  Pt has office visit Dec 6th.  No GI Issues experienced at this time.  She would like to meet the physician prior to having her perform her colonoscopy. Requesting Physician: Dr. Allegra Lai  PATIENT REVIEW QUESTIONS: The patient responded to the following health history questions as indicated:    1. Are you having any GI issues?  Small hemorrhoid in May 2023 2. Do you have a personal history of Polyps? no 3. Do you have a family history of Colon Cancer or Polyps? no 4. Diabetes Mellitus? no 5. Joint replacements in the past 12 months?no 6. Major health problems in the past 3 months?no 7. Any artificial heart valves, MVP, or defibrillator?no    MEDICATIONS & ALLERGIES:    Patient reports the following regarding taking any anticoagulation/antiplatelet therapy:   Plavix, Coumadin, Eliquis, Xarelto, Lovenox, Pradaxa, Brilinta, or Effient? no Aspirin? no  Patient confirms/reports the following medications:  Current Outpatient Medications  Medication Sig Dispense Refill   XULANE 150-35 MCG/24HR transdermal patch PLACE 1 PATCH ONTO THE SKIN ONCE A WEEK.  3   No current facility-administered medications for this visit.    Patient confirms/reports the following allergies:  Allergies  Allergen Reactions   Septra [Sulfamethoxazole-Trimethoprim] Anaphylaxis   Prednisone Other (See Comments)    "feel edgy"    No orders of the defined types were placed in this encounter.   AUTHORIZATION INFORMATION Primary Insurance: 1D#: Group #:  Secondary Insurance: 1D#: Group #:  SCHEDULE INFORMATION: Date: 03/02/22 Time: Location: [facility]

## 2021-11-27 ENCOUNTER — Other Ambulatory Visit: Payer: Self-pay | Admitting: Obstetrics and Gynecology

## 2021-11-27 DIAGNOSIS — Z1231 Encounter for screening mammogram for malignant neoplasm of breast: Secondary | ICD-10-CM

## 2021-12-21 ENCOUNTER — Ambulatory Visit
Admission: RE | Admit: 2021-12-21 | Discharge: 2021-12-21 | Disposition: A | Discharge status: Home / Self Care | Payer: BC Managed Care – PPO | Source: Ambulatory Visit | Attending: Obstetrics and Gynecology | Admitting: Obstetrics and Gynecology

## 2021-12-21 DIAGNOSIS — Z1231 Encounter for screening mammogram for malignant neoplasm of breast: Secondary | ICD-10-CM | POA: Insufficient documentation

## 2022-01-22 ENCOUNTER — Telehealth: Payer: Self-pay

## 2022-01-22 NOTE — Telephone Encounter (Signed)
Copied from CRM [id number]. Topic: General - Other >> Jan 22, 2022  3:13 PM Clide Dales wrote: Patient stated that she received her 1st dose of the Shingles vaccine at [facility] on 01/19/22 and is scheduled to get her 2nd dose on 03/23/22. Patient wants to know if she needs to bring any kind of documentation so it can be added to her chart. Please follow up with patient.

## 2022-01-22 NOTE — Telephone Encounter (Signed)
Noted  

## 2022-02-08 ENCOUNTER — Other Ambulatory Visit: Payer: Self-pay

## 2022-02-14 ENCOUNTER — Encounter: Payer: Self-pay | Admitting: Gastroenterology

## 2022-02-14 ENCOUNTER — Other Ambulatory Visit: Payer: Self-pay

## 2022-02-14 ENCOUNTER — Ambulatory Visit: Payer: BC Managed Care – PPO | Admitting: Gastroenterology

## 2022-02-14 VITALS — BP 135/87 | HR 87 | Temp 98.7°F | Ht 62.5 in | Wt 174.1 lb

## 2022-02-14 DIAGNOSIS — Z1211 Encounter for screening for malignant neoplasm of colon: Secondary | ICD-10-CM

## 2022-02-14 DIAGNOSIS — D509 Iron deficiency anemia, unspecified: Secondary | ICD-10-CM

## 2022-02-14 NOTE — Progress Notes (Signed)
Joan Repress, MD [address]  [address]  [place], [place] [address]  Main: [phone]  Fax: [phone]    Gastroenterology Consultation  Referring Provider:     Erasmo Downer, MD Primary Care Physician:  Erasmo Downer, MD Primary Gastroenterologist:  Dr. Arlyss Hunt Reason for Consultation: Discuss about colonoscopy        HPI:   Joan Hunt is a 51 y.o. female referred by Dr. Beryle Flock, Marzella Schlein, MD  for consultation & management of colon cancer screening.  Patient is scheduled to undergo screening colonoscopy on 02/28/2022.  She had questions regarding the procedure and colonoscopy prep.  Review of her labs revealed mild microcytic anemia and chronic thrombocytosis which has been ongoing since 2015.  She also has mild anemia which has been fluctuating between 11 and 13.  Patient denies any GI symptoms.  She does not smoke or drink alcohol  NSAIDs: None  Antiplts/Anticoagulants/Anti thrombotics: None  GI Procedures: None She denies any family history of GI malignancy  Past Medical History:  Diagnosis Date   Allergy     Past Surgical History:  Procedure Laterality Date   NO PAST SURGERIES       Current Outpatient Medications:    XULANE 150-35 MCG/24HR transdermal patch, PLACE 1 PATCH ONTO THE SKIN ONCE A WEEK., Disp: , Rfl: 3   Family History  Problem Relation Age of Onset   Breast cancer Maternal Aunt    Diabetes Maternal Aunt    Allergic rhinitis Mother    Hyperthyroidism Mother    Goiter Mother    Heart murmur Father    Healthy Sister    CAD Brother 8       s/p stent placement, smoker   Heart murmur Daughter    Sickle cell trait Son    Emphysema Maternal Grandfather    Stroke Paternal Grandmother 42   Healthy Brother    Stroke Paternal Uncle    Colon cancer Neg Hx    Cervical cancer Neg Hx    Ovarian cancer Neg Hx      Social History   Tobacco Use   Smoking status: Never   Smokeless tobacco: Never   Vaping Use   Vaping Use: Never used  Substance Use Topics   Alcohol use: No   Drug use: Never    Allergies as of 02/14/2022 - Review Complete 02/14/2022  Allergen Reaction Noted   Septra [sulfamethoxazole-trimethoprim] Anaphylaxis 09/09/2014   Prednisone Other (See Comments) 04/13/2016    Review of Systems:    All systems reviewed and negative except where noted in HPI.   Physical Exam:  BP 135/87 (BP Location: Left Arm, Patient Position: Sitting, Cuff Size: Normal)   Pulse 87   Temp 98.7 F (37.1 C) (Oral)   Ht 5' 2.5" (1.588 m)   Wt 174 lb 2 oz (79 kg)   BMI 31.34 kg/m  No LMP recorded.  General:   Alert,  Well-developed, well-nourished, pleasant and cooperative in NAD Head:  Normocephalic and atraumatic. Eyes:  Sclera clear, no icterus.   Conjunctiva pink. Ears:  Normal auditory acuity. Nose:  No deformity, discharge, or lesions. Mouth:  No deformity or lesions,oropharynx pink & moist. Neck:  Supple; no masses or thyromegaly. Lungs:  Respirations even and unlabored.  Clear throughout to auscultation.   No wheezes, crackles, or rhonchi. No acute distress. Heart:  Regular rate and rhythm; no murmurs, clicks, rubs, or gallops. Abdomen:  Normal bowel sounds. Soft, non-tender and non-distended  without masses, hepatosplenomegaly or hernias noted.  No guarding or rebound tenderness.   Rectal: Not performed Msk:  Symmetrical without gross deformities. Good, equal movement & strength bilaterally. Pulses:  Normal pulses noted. Extremities:  No clubbing or edema.  No cyanosis. Neurologic:  Alert and oriented x3;  grossly normal neurologically. Skin:  Intact without significant lesions or rashes. No jaundice. Psych:  Alert and cooperative. Normal mood and affect.  Imaging Studies: No abdominal imaging  Assessment and Plan:   Joan Hunt is a 51 y.o. female with no significant past medical history is seen in consultation to discuss about colonoscopy for colon cancer  screening.  All her questions regarding the colonoscopy have been answered.  Noticeably she has chronic microcytosis with thrombocytosis raising the suspicion for iron deficiency.  Discussed with patient for further evaluation  Check iron panel, B12 and folate levels today.  Advised patient that if iron deficiency is confirmed based on the results, recommend upper endoscopy along with colonoscopy and she is agreeable  Follow up based on the above workup   Joan Repress, MD

## 2022-02-15 LAB — B12 AND FOLATE PANEL
Folate: >20 ng/mL (ref >3.0)
Vitamin B-12: 227 pg/mL — ABNORMAL LOW (ref 232–1245)

## 2022-02-15 LAB — IRON,TIBC AND FERRITIN PANEL
Ferritin: 16 ng/mL (ref 15–150)
Iron Saturation: 11 % — ABNORMAL LOW (ref 15–55)
Iron: 52 ug/dL (ref 27–159)
Total Iron Binding Capacity: 464 ug/dL — ABNORMAL HIGH (ref 250–450)
UIBC: 412 ug/dL (ref 131–425)

## 2022-02-16 ENCOUNTER — Telehealth: Payer: Self-pay

## 2022-02-16 MED ORDER — FUSION PLUS PO CAPS: 1.0000 | ORAL_CAPSULE | Freq: Every day | ORAL | 2 refills | Status: DC

## 2022-02-16 NOTE — Telephone Encounter (Signed)
Patient verbalized understanding of results. Patient will take iron tablets and Vitamin b12 supplements. She is okay with added on a EGD. She wanted [name] to mail her the results in the mail. Mailed them to patient. Called trish and trish added on a EGD

## 2022-02-16 NOTE — Telephone Encounter (Signed)
-----   Message from Toney Reil, MD sent at 02/16/2022 10:51 AM EST ----- Morrie Sheldon  Please inform patient that she has severe iron deficiency and mild B12 deficiency.  Recommend EGD along with colonoscopy that has already been scheduled.  I have discussed about EGD during office visit.  Recommend fusion plus, 1 pill a day and recommend B12 supplements daily  Recommend PCP to check iron panel and B12 levels in 1 month  RV

## 2022-03-01 ENCOUNTER — Telehealth: Payer: Self-pay

## 2022-03-01 NOTE — Telephone Encounter (Signed)
Patient states that she ate Boiled eggs this morning and cheese. Informed patient since she ate we had to reschedule her procedure. She states she will not eat all day informed her no I can not promise she will be clean out. Moved patient to 03/26/22. Mailed out new instructions to patient

## 2022-03-13 ENCOUNTER — Telehealth: Payer: Self-pay

## 2022-03-13 NOTE — Telephone Encounter (Signed)
Dr. Marius Ditch is not going to be in the office on 03/26/2022 and needing to reschedule patient procedures. Called patient and patient would like to be moved to 04/06/22. Called ENDO and talk to [facility] and she is going to reschedule procedure. Sent new instructions in the mail to patient. Mailed new instructions to patient

## 2022-03-20 ENCOUNTER — Telehealth: Payer: Self-pay

## 2022-03-20 ENCOUNTER — Ambulatory Visit (INDEPENDENT_AMBULATORY_CARE_PROVIDER_SITE_OTHER): Payer: Self-pay | Admitting: Physician Assistant

## 2022-03-20 DIAGNOSIS — U071 COVID-19: Secondary | ICD-10-CM

## 2022-03-20 NOTE — Progress Notes (Signed)
Virtual Visit Consent   [name], you are scheduled for a virtual visit with a [facility] provider today. Just as with appointments in the office, your consent must be obtained to participate. Your consent will be active for this visit and any virtual visit you may have with one of our providers in the next 365 days. If you have a MyChart account, a copy of this consent can be sent to you electronically.  As this is a virtual telephone visit which doesn't allow your provider to perform a traditional examination and may limit your provider's ability to fully assess your condition. If your provider identifies any concerns that need to be evaluated in person or the need to arrange testing (such as labs, EKG, etc.), we will make arrangements to do so. Although advances in technology are sophisticated, we cannot ensure that it will always work on either your end or our end. If the connection with a video visit is poor, the visit may have to be switched to a telephone visit. With either a video or telephone visit, we are not always able to ensure that we have a secure connection.  By engaging in this virtual visit, you consent to the provision of healthcare and authorize for your insurance to be billed (if applicable) for the services provided during this visit. Depending on your insurance coverage, you may receive a charge related to this service.  I need to obtain your verbal consent now. Are you willing to proceed with your visit today? Joan Hunt has provided verbal consent on 03/20/2022 for a virtual visit (video or telephone). Joan Hunt, [place]  Date: 03/20/2022 9:40 AM  Virtual Visit via Telephone Note   I, Joan Hunt, connected with  Joan Hunt  ([id number], [birth year]) on 03/20/22 at  9:30 AM EST by a telephone and verified that I am speaking with the correct person using two identifiers.  Location: Patient: Virtual Visit Location Patient: Home Provider: Virtual  Visit Location Provider: Office/Clinic   I discussed the limitations of evaluation and management by telemedicine and the availability of in person appointments. The patient expressed understanding and agreed to proceed.    History of Present Illness: Joan Hunt is a 52 y.o. who identifies as a female who was assigned female at birth, and is being seen today for testing positive for covid.  HPI: 52 y/o F presents via telephone for c/o hoarseness of voice, cough, congestion, sore throat, and just generally not feeling well. She tested positive for covid yesterday. Has been taking otc Mcuinex cough and cold multi-symptom relief medicine.     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  Resting comfortably  at home.  Head is normocephalic, atraumatic.  No labored breathing.  Speech is clear and coherent with logical content.  Patient is alert and oriented at baseline.    Assessment and Plan: 1. COVID-19 virus infection  REST Stay well hydrated Continue to take otc medicines as directed on the box.  Watch for  worsening symptoms.  Pt will contact us if she has any questions or concerns.  Reviewed CDC guidelines with patient. Pt will be isolated for 5 days from the start of her symptoms. Needs to be fever free for 24 hours without the aid of medicine before returning back to work. She must wear a mask for an additional 5 days.  She requested q work excuse and one was provided to her via email.   Follow Up Instructions: I discussed the assessment and treatment plan with the patient. The patient was provided an opportunity to ask questions and all were answered. The patient agreed with the plan and demonstrated an understanding of the instructions.  A copy of instructions were sent to the patient via MyChart unless otherwise noted below.    The patient was advised to call back or seek an in-person evaluation if the symptoms worsen or if the condition fails to improve as anticipated.  Time:  I spent 15 minutes with the patient via telehealth technology discussing the above problems/concerns.    Joan Acosta, PA-C

## 2022-03-20 NOTE — Telephone Encounter (Signed)
Copied from [place] [id number]. Topic: General - Inquiry >> Mar 20, 2022 10:27 AM Penni Bombard wrote: Reason for CRM: Pt called back saying she did test positive for covid.  She will call back if needed.  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

## 2022-03-20 NOTE — Telephone Encounter (Signed)
Noted  

## 2022-04-02 ENCOUNTER — Encounter: Payer: Self-pay | Admitting: Family Medicine

## 2022-04-02 ENCOUNTER — Telehealth: Payer: Self-pay | Admitting: Gastroenterology

## 2022-04-02 ENCOUNTER — Ambulatory Visit: Payer: BC Managed Care – PPO | Admitting: Family Medicine

## 2022-04-02 VITALS — BP 99/68 | HR 112 | Temp 99.1°F | Resp 16 | Wt 175.7 lb

## 2022-04-02 DIAGNOSIS — R058 Other specified cough: Secondary | ICD-10-CM

## 2022-04-02 MED ORDER — GUAIFENESIN-CODEINE 100-10 MG/5ML PO SOLN: 5.0000 mL | Freq: Three times a day (TID) | ORAL | 0 refills | Status: DC | PRN

## 2022-04-02 MED ORDER — BENZONATATE 100 MG PO CAPS: 100.0000 mg | ORAL_CAPSULE | Freq: Two times a day (BID) | ORAL | 0 refills | Status: DC | PRN

## 2022-04-02 NOTE — Telephone Encounter (Signed)
Called Trish in endo because I talk to [place] on 03/13/22 and patient was supposed to moved to 01/26. Trish moved patient to 04/06/2022. Called patient and she will let us know if the funeral is on Friday

## 2022-04-02 NOTE — Telephone Encounter (Signed)
Patient called stating that she has had a death in the family and will have to reschedule her procedure if her procedure is scheduled for this Friday. Her paperwork says January 26th and it looks like her procedure is scheduled for February 26th. She is requesting a call back.

## 2022-04-02 NOTE — Progress Notes (Signed)
I,Sulibeya S Dimas,acting as a Education administrator for Lavon Paganini, MD.,have documented all relevant documentation on the behalf of Lavon Paganini, MD,as directed by  Lavon Paganini, MD while in the presence of Lavon Paganini, MD.     Established patient visit   Patient: Joan Hunt   DOB: May 12, 1970   52 y.o. Female  MRN: [id number] Visit Date: 04/02/2022  Today's healthcare provider: Lavon Paganini, MD   Chief Complaint  Patient presents with   Cough   Subjective     Upper respiratory symptoms She complains of cough described as nonproductive.with no fever, chills, night sweats or weight loss. Onset of symptoms was  2 weeks ago and staying constant.she reports she had COVID two weeks ago.She is drinking plenty of fluids.  Past history is significant for no history of pneumonia or bronchitis. Patient is non-smoker  Positive for COVID 2 weeks ago. Feeling mostly better except for persistent cough. Taking Zyrtec D, cough drops, Delsym, Vicks humidifier and vaporub. ---------------------------------------------------------------------------------------------------   Medications: Outpatient Medications Prior to Visit  Medication Sig   Iron-FA-B Cmp-C-Biot-Probiotic (FUSION PLUS) CAPS Take 1 capsule by mouth daily.   XULANE 150-35 MCG/24HR transdermal patch PLACE 1 PATCH ONTO THE SKIN ONCE A WEEK.   No facility-administered medications prior to visit.    Review of Systems  Constitutional:  Negative for chills and fever.  HENT:  Positive for congestion and postnasal drip. Negative for ear pain.   Respiratory:  Positive for cough. Negative for chest tightness, shortness of breath and wheezing.   Cardiovascular:  Negative for chest pain.  Gastrointestinal:  Negative for abdominal pain, nausea and vomiting.       Objective    BP 99/68 (BP Location: Left Arm, Patient Position: Sitting, Cuff Size: Large)   Pulse (!) 112   Temp 99.1 F (37.3 C) (Temporal)   Resp  16   Wt 175 lb 11.2 oz (79.7 kg)   SpO2 98%   BMI 31.62 kg/m  BP Readings from Last 3 Encounters:  04/02/22 99/68  02/14/22 135/87  08/28/21 117/74   Wt Readings from Last 3 Encounters:  04/02/22 175 lb 11.2 oz (79.7 kg)  02/14/22 174 lb 2 oz (79 kg)  08/28/21 171 lb 9.6 oz (77.8 kg)      Physical Exam Vitals reviewed.  Constitutional:      General: She is not in acute distress.    Appearance: Normal appearance. She is well-developed. She is not diaphoretic.  HENT:     Head: Normocephalic and atraumatic.  Eyes:     General: No scleral icterus.    Conjunctiva/sclera: Conjunctivae normal.  Neck:     Thyroid: No thyromegaly.  Cardiovascular:     Rate and Rhythm: Normal rate and regular rhythm.     Heart sounds: Normal heart sounds. No murmur heard. Pulmonary:     Effort: Pulmonary effort is normal. No respiratory distress.     Breath sounds: Normal breath sounds. No wheezing, rhonchi or rales.  Musculoskeletal:     Cervical back: Neck supple.  Lymphadenopathy:     Cervical: No cervical adenopathy.  Skin:    General: Skin is warm and dry.     Findings: No rash.  Neurological:     Mental Status: She is alert and oriented to person, place, and time. Mental status is at baseline.  Psychiatric:        Mood and Affect: Mood normal.        Behavior: Behavior normal.      No  results found for any visits on 04/02/22.  Assessment & Plan     1. Post-viral cough syndrome - symptoms and exam c/w postviral cough  - no evidence of strep pharyngitis, CAP, AOM, bacterial sinusitis, or other bacterial infection - discussed symptomatic management, natural course, and return precautions  - ok to use Rx cough medications prn  Meds ordered this encounter  Medications   guaiFENesin-codeine 100-10 MG/5ML syrup    Sig: Take 5 mLs by mouth 3 (three) times daily as needed for cough.    Dispense:  120 mL    Refill:  0   benzonatate (TESSALON) 100 MG capsule    Sig: Take 1 capsule  (100 mg total) by mouth 2 (two) times daily as needed for cough.    Dispense:  20 capsule    Refill:  0     Return if symptoms worsen or fail to improve.      I, Lavon Paganini, MD, have reviewed all documentation for this visit. The documentation on 04/02/22 for the exam, diagnosis, procedures, and orders are all accurate and complete.   Krishan Mcbreen, Dionne Bucy, MD, MPH [facility]

## 2022-04-03 ENCOUNTER — Telehealth: Payer: Self-pay | Admitting: Gastroenterology

## 2022-04-03 NOTE — Telephone Encounter (Signed)
Pt canceled procedure for 04/06/2022 because of death in family would like a call back to resch [phone]

## 2022-04-04 NOTE — Telephone Encounter (Signed)
Patient currently scheduled with Dr. Marius Ditch for her Colonoscopy on 04/06/22.  She will need to reschedule due to a death in the family.  She plans to call me back this afternoon or tomorrow to let me know what date will work for her.  She will remain on the schedule until she calls me back to reschedule.  Thanks,  [place], [place]

## 2022-04-05 NOTE — Telephone Encounter (Signed)
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  Informed Baker Janus that patient informed me that she was going to call me back yesterday afternoon or this morning to reschedule and she did not. This is why she was left on the schedule for tomorrow-waiting for her to reschedule.  After speaking with her this morning she stated that she is not ready to reschedule procedure.  Thanks, [place], [place]

## 2022-04-06 ENCOUNTER — Encounter: Admission: RE | Payer: Self-pay | Source: Ambulatory Visit

## 2022-04-06 ENCOUNTER — Ambulatory Visit
Admission: RE | Admit: 2022-04-06 | Payer: BC Managed Care – PPO | Source: Ambulatory Visit | Admitting: Gastroenterology

## 2022-04-06 SURGERY — COLONOSCOPY WITH PROPOFOL
Anesthesia: General

## 2022-04-12 ENCOUNTER — Ambulatory Visit: Payer: Self-pay | Admitting: *Deleted

## 2022-04-12 NOTE — Telephone Encounter (Signed)
Summary: Cough, declined appt offer.   Pt called requesting to have something stronger called in for her, she still has a lingering cough from having Covid.  Best contact: [phone]  She declined offer for an appt, says she just wants to have a phone call today from her PCP and have something prescribed without having to come in for a visit.  [id number] Joan Hunt [address] [place] [address] Phone: [phone] Fax: [phone]           Chief Complaint: persistent cough requesting stronger medication  Symptoms: persistent dry cough, if productive sputum clear. S/p covid since beginning Jan. Can not get rid of cough. Can not rest at night more than 2-3 hours after guaifenesin- codeine syrup and can not take during the day at work. Cough interferes with meetings at work. Tessalon capsules not effective to help with cough. Taking delsym with little relief and still using vicks shower tablets once daily and vapor rub at times.  Frequency: greater than 3 weeks  Pertinent Negatives: Patient denies chest pain no difficulty breathing no fever.  Disposition: [] ED /[] Urgent Care (no appt availability in office) / [] Appointment(In office/virtual)/ []  [facility]/ [] Home Care/ [] Refused Recommended Disposition /[] [facility]/ [x]  Follow-up with PCP Additional Notes:   Requesting stronger medication or any advise for cough. Please advise if another appt needed.        Reason for Disposition  [1] Dry lingering cough AND [2] recent cold symptoms (e.g., runny nose, fever)  Answer Assessment - Initial Assessment Questions 1. ONSET: "When did the cough begin?"      S/p covid and persistent cough  2. SEVERITY: "How bad is the cough today?"      Interferes with work and sleep  3. SPUTUM: "Describe the color of your sputum" (none, dry cough; clear, white, yellow, green)     Dry if productive clear  4. HEMOPTYSIS: "Are you  coughing up any blood?" If so ask: "How much?" (flecks, streaks, tablespoons, etc.)     na 5. DIFFICULTY BREATHING: "Are you having difficulty breathing?" If Yes, ask: "How bad is it?" (e.g., mild, moderate, severe)    - MILD: No SOB at rest, mild SOB with walking, speaks normally in sentences, can lie down, no retractions, pulse < 100.    - MODERATE: SOB at rest, SOB with minimal exertion and prefers to sit, cannot lie down flat, speaks in phrases, mild retractions, audible wheezing, pulse 100-120.    - SEVERE: Very SOB at rest, speaks in single words, struggling to breathe, sitting hunched forward, retractions, pulse > 120      Denies  6. FEVER: "Do you have a fever?" If Yes, ask: "What is your temperature, how was it measured, and when did it start?"     na 7. CARDIAC HISTORY: "Do you have any history of heart disease?" (e.g., heart attack, congestive heart failure)      na 8. LUNG HISTORY: "Do you have any history of lung disease?"  (e.g., pulmonary embolus, asthma, emphysema)     na 9. PE RISK FACTORS: "Do you have a history of blood clots?" (or: recent major surgery, recent prolonged travel, bedridden)     na 10. OTHER SYMPTOMS: "Do you have any other symptoms?" (e.g., runny nose, wheezing, chest pain)       Dry cough, interferes at work during meetings. Medication not strong enough and tessalon not effective.  11. PREGNANCY: "Is there any chance  you are pregnant?" "When was your last menstrual period?"       na 12. TRAVEL: "Have you traveled out of the country in the last month?" (e.g., travel history, exposures)       na  Protocols used: Cough - Chronic-A-AH

## 2022-04-12 NOTE — Telephone Encounter (Signed)
She's got what there is for cough medications.  The codeine cough syrups are the strongest and they can make you drowsy. She can try the codeine during the day too, but I don't have any other options for her.

## 2022-04-13 MED ORDER — GUAIFENESIN-CODEINE 100-10 MG/5ML PO SOLN: 5.0000 mL | Freq: Three times a day (TID) | ORAL | 0 refills | Status: DC | PRN

## 2022-04-13 NOTE — Telephone Encounter (Signed)
Rx sent 

## 2022-04-13 NOTE — Addendum Note (Signed)
Addended by: Virginia Crews on: 04/13/2022 11:14 AM   Modules accepted: Orders

## 2022-04-17 ENCOUNTER — Other Ambulatory Visit: Payer: Self-pay | Admitting: Family Medicine

## 2022-04-17 ENCOUNTER — Telehealth: Payer: Self-pay

## 2022-04-17 MED ORDER — GUAIFENESIN-CODEINE 100-10 MG/5ML PO SOLN: 5.0000 mL | Freq: Three times a day (TID) | ORAL | 0 refills | Status: DC | PRN

## 2022-04-17 NOTE — Telephone Encounter (Signed)
Pt is at pharmacy and is upset that her cough medicine is not there.  Pharmacy to send prescription is:  Preferred Pharmacy (with phone number or street name): [place], [place] [address] [phone] fx [phone]

## 2022-04-17 NOTE — Telephone Encounter (Signed)
Called pharmacy where medication was called into.  S/w mark. They do not have the medication in stock. They are unable to transfer the medication to another store.

## 2022-04-17 NOTE — Telephone Encounter (Signed)
Medication Refill - Medication: guaiFENesin-codeine 100-10 MG/5ML syrup [facility] [address] [facility] , pt is asking for [facility] to be [facility]  Has the patient contacted their pharmacy? yes (Agent: If no, request that the patient contact the pharmacy for the refill. If patient does not wish to contact the pharmacy document the reason why and proceed with request.) (Agent: If yes, when and what did the pharmacy advise?)contact pcp  Preferred Pharmacy (with phone number or street name): [place], [place] [address] [phone] fx [phone] Has the patient been seen for an appointment in the last year OR does the patient have an upcoming appointment? yes  Agent: Please be advised that RX refills may take up to 3 business days. We ask that you follow-up with your pharmacy.

## 2022-04-17 NOTE — Telephone Encounter (Signed)
Pt called for update on med request, she is requesting for her PCP to call her before 12 04/18/2022. She says she is going to interrupt her day to go and pick up the medicine. Says she is very frustrated.

## 2022-04-17 NOTE — Telephone Encounter (Signed)
Called on call provider to ask if they would call in medication for pt. Provider stated he would . Per chart medicine called in receipt confirmed by pharmacy at 6:07 pm  Called pt  back to tell her medication was called in.  Held the phone while pt's husband's verified that medication was called in. Husband called pharmacy. Pharmacy states they have not received the refill from Dr. Caryn Section.  Pt is requesting a call back from Dr. Brita Romp tomorrow morning.

## 2022-04-17 NOTE — Telephone Encounter (Signed)
Pt called back asking if the prescription was going to be sent to the pharmacy today.  She says she has been waiting since Friday.  The other CV was on back order.

## 2022-04-18 NOTE — Telephone Encounter (Signed)
Called pt back to follow-up.  Apologized to patient and suggested sign up trough MyChart for a more direct response in the future.    Pt was understanding but just frustrated it had to go though so many channels to get it switched to a different pharmacy.    Also - pt stated there was complication with pharmacy communication with patient as well.

## 2022-05-31 ENCOUNTER — Telehealth: Payer: Self-pay | Admitting: Gastroenterology

## 2022-05-31 ENCOUNTER — Other Ambulatory Visit: Payer: Self-pay | Admitting: Gastroenterology

## 2022-05-31 NOTE — Telephone Encounter (Signed)
Pt is requesting call back  needs pre-auth on iron supplement would like to discuss reschedule of procedure

## 2022-06-07 NOTE — Telephone Encounter (Signed)
error 

## 2022-06-12 ENCOUNTER — Other Ambulatory Visit: Payer: Self-pay | Admitting: Family Medicine

## 2022-06-12 NOTE — Telephone Encounter (Signed)
Medication Refill - Medication: Iron-FA-B Cmp-C-Biot-Probiotic (FUSION PLUS) CAPS   Has the patient contacted their pharmacy? Yes.     Preferred Pharmacy (with phone number or street name):  [id number] [place] Phone: [phone]  Fax: [phone]     Has the patient been seen for an appointment in the last year OR does the patient have an upcoming appointment? Yes.    The patient states her gastro doctor, Dr Marius Ditch prescribed this for her but she is not able to get a hold of that provider and no one will return her call. Please assist patient further as she says she notices a difference when she takes it.

## 2022-06-12 NOTE — Telephone Encounter (Signed)
Requested medication (s) are due for refill today: routing for review  Requested medication (s) are on the active medication list: yes  Last refill:  02/16/22  Future visit scheduled: yes  Notes to clinic:  Unable to refill per protocol, last refill by another provider.      Requested Prescriptions  Pending Prescriptions Disp Refills   Iron-FA-B Cmp-C-Biot-Probiotic (FUSION PLUS) CAPS 30 capsule 2    Sig: Take 1 capsule by mouth daily.     There is no refill protocol information for this order

## 2022-06-14 MED ORDER — FUSION PLUS PO CAPS: 1.0000 | ORAL_CAPSULE | Freq: Every day | ORAL | 2 refills | Status: DC

## 2022-07-11 NOTE — Progress Notes (Deleted)
I,Braxxton Stoudt S Ziggy Reveles,acting as a Neurosurgeon for Shirlee Latch, MD.,have documented all relevant documentation on the behalf of Shirlee Latch, MD,as directed by  Shirlee Latch, MD while in the presence of Shirlee Latch, MD.    Complete physical exam   Patient: Joan Hunt   DOB: 1970/07/27   52 y.o. Female  MRN: [id number] Visit Date: 07/12/2022  Today's healthcare provider: Shirlee Latch, MD   No chief complaint on file.  Subjective    Sebrena Engh is a 52 y.o. female who presents today for a complete physical exam.  She reports consuming a {diet types:17450} diet. {Exercise:19826} She generally feels {well/fairly well/poorly:18703}. She reports sleeping {well/fairly well/poorly:18703}. She {does/does not:200015} have additional problems to discuss today.  HPI    Past Medical History:  Diagnosis Date   Allergy    Past Surgical History:  Procedure Laterality Date   NO PAST SURGERIES     Social History   Socioeconomic History   Marital status: Married    Spouse name: Charles A. Janalyn Shy.   Number of children: 2   Years of education: Not on file   Highest education level: Master's degree (e.g., MA, MS, MEng, MEd, MSW, MBA)  Occupational History   Occupation: unemployed  Tobacco Use   Smoking status: Never   Smokeless tobacco: Never  Vaping Use   Vaping Use: Never used  Substance and Sexual Activity   Alcohol use: No   Drug use: Never   Sexual activity: Yes    Partners: Male    Birth control/protection: Patch  Other Topics Concern   Not on file  Social History Narrative   Not on file   Social Determinants of Health   Financial Resource Strain: Low Risk  (06/03/2017)   Overall Financial Resource Strain (CARDIA)    Difficulty of Paying Living Expenses: Not hard at all  Food Insecurity: No Food Insecurity (06/03/2017)   Hunger Vital Sign    Worried About Running Out of Food in the Last Year: Never true    Ran Out of Food in the  Last Year: Never true  Transportation Needs: No Transportation Needs (06/03/2017)   PRAPARE - Administrator, Civil Service (Medical): No    Lack of Transportation (Non-Medical): No  Physical Activity: Inactive (06/03/2017)   Exercise Vital Sign    Days of Exercise per Week: 0 days    Minutes of Exercise per Session: 0 min  Stress: Not on file  Social Connections: Not on file  Intimate Partner Violence: Not on file   Family Status  Relation Name Status   Mat Aunt  (Not Specified)   Mother  Alive   Father  Alive   Sister  Alive   Brother  Alive   Daughter  Alive   Son  Alive   [employer]  (Not Specified)   PGM  Deceased   Brother  Press photographer  (Not Specified)   Neg Hx  (Not Specified)   Family History  Problem Relation Age of Onset   Breast cancer Maternal Aunt    Diabetes Maternal Aunt    Allergic rhinitis Mother    Hyperthyroidism Mother    Goiter Mother    Heart murmur Father    Healthy Sister    CAD Brother 26       s/p stent placement, smoker   Heart murmur Daughter    Sickle cell trait Son    Emphysema Maternal Grandfather    Stroke Paternal Grandmother  75   Healthy Brother    Stroke Paternal Uncle    Colon cancer Neg Hx    Cervical cancer Neg Hx    Ovarian cancer Neg Hx    Allergies  Allergen Reactions   Septra [Sulfamethoxazole-Trimethoprim] Anaphylaxis   Prednisone Other (See Comments)    "feel edgy"    Patient Care Team: Erasmo Downer, MD as PCP - General (Family Medicine)   Medications: Outpatient Medications Prior to Visit  Medication Sig   benzonatate (TESSALON) 100 MG capsule Take 1 capsule (100 mg total) by mouth 2 (two) times daily as needed for cough.   guaiFENesin-codeine 100-10 MG/5ML syrup Take 5 mLs by mouth 3 (three) times daily as needed for cough.   Iron-FA-B Cmp-C-Biot-Probiotic (FUSION PLUS) CAPS Take 1 capsule by mouth daily.   XULANE 150-35 MCG/24HR transdermal patch PLACE 1 PATCH ONTO THE SKIN ONCE A WEEK.    No facility-administered medications prior to visit.    Review of Systems  All other systems reviewed and are negative.   Last CBC Lab Results  Component Value Date   WBC 9.5 09/01/2021   HGB 11.6 09/01/2021   HCT 35.9 09/01/2021   MCV 75 (L) 09/01/2021   MCH 24.2 (L) 09/01/2021   RDW 14.5 09/01/2021   PLT 565 (H) 09/01/2021   Last metabolic panel Lab Results  Component Value Date   GLUCOSE 94 09/01/2021   NA 137 09/01/2021   K 4.2 09/01/2021   CL 100 09/01/2021   CO2 20 09/01/2021   BUN 11 09/01/2021   CREATININE 0.93 09/01/2021   EGFR 73 09/01/2021   CALCIUM 9.6 09/01/2021   PROT 7.4 09/01/2021   ALBUMIN 4.3 09/01/2021   LABGLOB 3.1 09/01/2021   AGRATIO 1.4 09/01/2021   BILITOT 0.3 09/01/2021   ALKPHOS 67 09/01/2021   AST 17 09/01/2021   ALT 13 09/01/2021   ANIONGAP 13 06/25/2018   Last lipids Lab Results  Component Value Date   CHOL 221 (H) 09/01/2021   HDL 47 09/01/2021   LDLCALC 138 (H) 09/01/2021   TRIG 201 (H) 09/01/2021   CHOLHDL 4.7 (H) 09/01/2021   Last thyroid functions Lab Results  Component Value Date   TSH 2.310 07/06/2020   Last vitamin D Lab Results  Component Value Date   VD25OH 38.8 07/06/2020   Last vitamin B12 and Folate Lab Results  Component Value Date   VITAMINB12 227 (L) 02/14/2022   FOLATE >20.0 02/14/2022      Objective    There were no vitals taken for this visit. BP Readings from Last 3 Encounters:  04/02/22 99/68  02/14/22 135/87  08/28/21 117/74   Wt Readings from Last 3 Encounters:  04/02/22 175 lb 11.2 oz (79.7 kg)  02/14/22 174 lb 2 oz (79 kg)  08/28/21 171 lb 9.6 oz (77.8 kg)       Physical Exam  ***  Last depression screening scores    04/02/2022    2:37 PM 08/28/2021    3:31 PM 06/12/2021   10:05 AM  PHQ 2/9 Scores  PHQ - 2 Score 0 0 0  PHQ- 9 Score 2 0 2   Last fall risk screening    04/02/2022    2:37 PM  Fall Risk   Falls in the past year? 0  Number falls in past yr: 0  Injury  with Fall? 0  Risk for fall due to : No Fall Risks  Follow up Falls evaluation completed   Last Audit-C alcohol use screening  04/02/2022    2:38 PM  Alcohol Use Disorder Test (AUDIT)  1. How often do you have a drink containing alcohol? 0  2. How many drinks containing alcohol do you have on a typical day when you are drinking? 0  3. How often do you have six or more drinks on one occasion? 0  AUDIT-C Score 0   A score of 3 or more in women, and 4 or more in men indicates increased risk for alcohol abuse, EXCEPT if all of the points are from question 1   No results found for any visits on 07/12/22.  Assessment & Plan    Routine Health Maintenance and Physical Exam  Exercise Activities and Dietary recommendations  Goals   None     Immunization History  Administered Date(s) Administered   Influenza,inj,Quad PF,6+ Mos 12/16/2016, 12/08/2017   Influenza-Unspecified 12/11/2021   MMR 10/24/1989   PFIZER Comirnaty(Gray Top)Covid-19 Tri-Sucrose Vaccine 02/28/2020   PFIZER(Purple Top)SARS-COV-2 Vaccination 06/18/2019, 07/09/2019, 02/28/2020   Td 10/24/1989   Tdap 05/15/2010, 07/05/2020   Zoster Recombinat (Shingrix) 01/19/2022, 06/18/2022    Health Maintenance  Topic Date Due   COLONOSCOPY (Pts 45-77yrs Insurance coverage will need to be confirmed)  Never done   COVID-19 Vaccine (5 - 2023-24 season) 11/10/2021   INFLUENZA VACCINE  10/11/2022   PAP SMEAR-Modifier  11/09/2023   MAMMOGRAM  12/22/2023   DTaP/Tdap/Td (4 - Td or Tdap) 07/06/2030   Hepatitis C Screening  Completed   HIV Screening  Completed   Zoster Vaccines- Shingrix  Completed   HPV VACCINES  Aged Out    Discussed health benefits of physical activity, and encouraged her to engage in regular exercise appropriate for her age and condition.  ***  No follow-ups on file.     {provider attestation***:1}   Shirlee Latch, MD  [facility] [phone] (phone) [phone]  (fax)  [facility]

## 2022-07-12 ENCOUNTER — Encounter: Payer: BC Managed Care – PPO | Admitting: Family Medicine

## 2022-07-12 DIAGNOSIS — D500 Iron deficiency anemia secondary to blood loss (chronic): Secondary | ICD-10-CM

## 2022-07-12 DIAGNOSIS — E669 Obesity, unspecified: Secondary | ICD-10-CM

## 2022-07-12 DIAGNOSIS — Z0000 Encounter for general adult medical examination without abnormal findings: Secondary | ICD-10-CM

## 2022-07-12 DIAGNOSIS — E782 Mixed hyperlipidemia: Secondary | ICD-10-CM

## 2022-07-20 ENCOUNTER — Encounter: Payer: Self-pay | Admitting: Family Medicine

## 2022-07-20 ENCOUNTER — Ambulatory Visit (INDEPENDENT_AMBULATORY_CARE_PROVIDER_SITE_OTHER): Payer: BC Managed Care – PPO | Admitting: Family Medicine

## 2022-07-20 VITALS — BP 118/80 | HR 83 | Temp 98.3°F | Resp 14 | Ht 62.5 in | Wt 172.2 lb

## 2022-07-20 DIAGNOSIS — Z1231 Encounter for screening mammogram for malignant neoplasm of breast: Secondary | ICD-10-CM | POA: Insufficient documentation

## 2022-07-20 DIAGNOSIS — D500 Iron deficiency anemia secondary to blood loss (chronic): Secondary | ICD-10-CM | POA: Diagnosis not present

## 2022-07-20 DIAGNOSIS — R739 Hyperglycemia, unspecified: Secondary | ICD-10-CM

## 2022-07-20 DIAGNOSIS — E6609 Other obesity due to excess calories: Secondary | ICD-10-CM

## 2022-07-20 DIAGNOSIS — Z5320 Procedure and treatment not carried out because of patient's decision for unspecified reasons: Secondary | ICD-10-CM | POA: Insufficient documentation

## 2022-07-20 DIAGNOSIS — Z0000 Encounter for general adult medical examination without abnormal findings: Secondary | ICD-10-CM | POA: Insufficient documentation

## 2022-07-20 DIAGNOSIS — E782 Mixed hyperlipidemia: Secondary | ICD-10-CM | POA: Diagnosis not present

## 2022-07-20 DIAGNOSIS — Z6830 Body mass index (BMI) 30.0-30.9, adult: Secondary | ICD-10-CM

## 2022-07-20 DIAGNOSIS — L918 Other hypertrophic disorders of the skin: Secondary | ICD-10-CM

## 2022-07-20 DIAGNOSIS — I83813 Varicose veins of bilateral lower extremities with pain: Secondary | ICD-10-CM

## 2022-07-20 DIAGNOSIS — E538 Deficiency of other specified B group vitamins: Secondary | ICD-10-CM

## 2022-07-20 DIAGNOSIS — Z1211 Encounter for screening for malignant neoplasm of colon: Secondary | ICD-10-CM

## 2022-07-20 NOTE — Assessment & Plan Note (Signed)
Chronic, with occasional inflammation Referral to derm to discuss options for removal

## 2022-07-20 NOTE — Assessment & Plan Note (Signed)
Missed previous referral d/t illness Request for repeat referral to Dr Allegra Lai No changes in risk factors or s/s

## 2022-07-20 NOTE — Assessment & Plan Note (Signed)
Chronic, on supplementation Reports having more energy and less leg pain since starting supplementation 

## 2022-07-20 NOTE — Progress Notes (Deleted)
    I,Bodin Gorka,acting as a Neurosurgeon for Jacky Kindle, FNP.,have documented all relevant documentation on the behalf of Jacky Kindle, FNP,as directed by  Jacky Kindle, FNP while in the presence of Jacky Kindle, FNP.    Established patient visit   Patient: Joan Hunt   DOB: Jul 22, 1970   52 y.o. Female  MRN: [id number] Visit Date: 07/20/2022  Today's healthcare provider: Jacky Kindle, FNP   No chief complaint on file.  Subjective    HPI  ***  Medications: Outpatient Medications Prior to Visit  Medication Sig   benzonatate (TESSALON) 100 MG capsule Take 1 capsule (100 mg total) by mouth 2 (two) times daily as needed for cough.   guaiFENesin-codeine 100-10 MG/5ML syrup Take 5 mLs by mouth 3 (three) times daily as needed for cough.   Iron-FA-B Cmp-C-Biot-Probiotic (FUSION PLUS) CAPS Take 1 capsule by mouth daily.   XULANE 150-35 MCG/24HR transdermal patch PLACE 1 PATCH ONTO THE SKIN ONCE A WEEK.   No facility-administered medications prior to visit.    Review of Systems  All other systems reviewed and are negative.   {Labs  Heme  Chem  Endocrine  Serology  Results Review (optional):23779}   Objective    There were no vitals taken for this visit. {Show previous Kevante Lunt signs (optional):23777}  Physical Exam  ***  No results found for any visits on 07/20/22.  Assessment & Plan     ***  No follow-ups on file.      {provider attestation***:1}   Jacky Kindle, FNP  [facility] [phone] (phone) [phone] (fax)  [facility]

## 2022-07-20 NOTE — Assessment & Plan Note (Signed)
Chronic, previously elevated Repeat LP recommend diet low in saturated fat and regular exercise - 30 min at least 5 times per week The 10-year ASCVD risk score (Arnett DK, et al., 2019) is: 2%

## 2022-07-20 NOTE — Assessment & Plan Note (Signed)
Due for screening for mammogram, denies breast concerns, provided with phone number to call and schedule appointment for mammogram. Encouraged to repeat breast cancer screening every 1-2 years.  

## 2022-07-20 NOTE — Progress Notes (Signed)
I,Vanessa  Vital,acting as a Neurosurgeon for Jacky Kindle, FNP.,have documented all relevant documentation on the behalf of Jacky Kindle, FNP,as directed by  Jacky Kindle, FNP while in the presence of Jacky Kindle, FNP.    Complete physical exam   Patient: Joan Hunt   DOB: 1971/02/24   52 y.o. Female  MRN: [id number] Visit Date: 07/20/2022  Today's healthcare provider: Jacky Kindle, FNP  Introduced to nurse practitioner role and practice setting.  All questions answered.  Discussed provider/patient relationship and expectations.  Chief Complaint  Patient presents with   Annual Exam   Subjective    Shonnon Dybdahl is a 52 y.o. female who presents today for a complete physical exam.  She reports consuming a general diet. Home exercise routine includes walking 1 hrs per day. She generally feels well. She reports sleeping well. She does have additional problems to discuss today.  HPI  Patient is here today for an annual physical, but would also like to discuss options about veins on her foot that have become more visible.  Past Medical History:  Diagnosis Date   Allergy    Past Surgical History:  Procedure Laterality Date   NO PAST SURGERIES     Social History   Socioeconomic History   Marital status: Married    Spouse name: Charles A. Janalyn Shy.   Number of children: 2   Years of education: Not on file   Highest education level: Master's degree (e.g., MA, MS, MEng, MEd, MSW, MBA)  Occupational History   Occupation: unemployed  Tobacco Use   Smoking status: Never   Smokeless tobacco: Never  Vaping Use   Vaping Use: Never used  Substance and Sexual Activity   Alcohol use: No   Drug use: Never   Sexual activity: Yes    Partners: Male    Birth control/protection: Patch  Other Topics Concern   Not on file  Social History Narrative   Not on file   Social Determinants of Health   Financial Resource Strain: Low Risk  (06/03/2017)   Overall Financial  Resource Strain (CARDIA)    Difficulty of Paying Living Expenses: Not hard at all  Food Insecurity: No Food Insecurity (06/03/2017)   Hunger Vital Sign    Worried About Running Out of Food in the Last Year: Never true    Ran Out of Food in the Last Year: Never true  Transportation Needs: No Transportation Needs (06/03/2017)   PRAPARE - Administrator, Civil Service (Medical): No    Lack of Transportation (Non-Medical): No  Physical Activity: Inactive (06/03/2017)   Exercise Vital Sign    Days of Exercise per Week: 0 days    Minutes of Exercise per Session: 0 min  Stress: Not on file  Social Connections: Not on file  Intimate Partner Violence: Not on file   Family Status  Relation Name Status   Mother  Deceased   Father  Alive   Sister  Alive   Brother  Alive   Brother  Alive   Daughter  Alive   Son  Alive   Mat Aunt  (Not Specified)   Nutritional therapist  (Not Specified)   MGF  (Not Specified)   PGM  Deceased   Neg Hx  (Not Specified)   Family History  Problem Relation Age of Onset   Allergic rhinitis Mother    Hyperthyroidism Mother    Goiter Mother    Kidney failure Mother  Heart murmur Father    Healthy Sister    CAD Brother 62       s/p stent placement, smoker   Healthy Brother    Heart murmur Daughter    Sickle cell trait Son    Breast cancer Maternal Aunt    Diabetes Maternal Aunt    Stroke Paternal Uncle    Emphysema Maternal Grandfather    Stroke Paternal Grandmother 69   Colon cancer Neg Hx    Cervical cancer Neg Hx    Ovarian cancer Neg Hx    Allergies  Allergen Reactions   Septra [Sulfamethoxazole-Trimethoprim] Anaphylaxis   Prednisone Other (See Comments)    "feel edgy"    Patient Care Team: Erasmo Downer, MD as PCP - General (Family Medicine)   Medications: Outpatient Medications Prior to Visit  Medication Sig   Iron-FA-B Cmp-C-Biot-Probiotic (FUSION PLUS) CAPS Take 1 capsule by mouth daily.   XULANE 150-35 MCG/24HR transdermal  patch PLACE 1 PATCH ONTO THE SKIN ONCE A WEEK.   benzonatate (TESSALON) 100 MG capsule Take 1 capsule (100 mg total) by mouth 2 (two) times daily as needed for cough. (Patient not taking: Reported on 07/20/2022)   guaiFENesin-codeine 100-10 MG/5ML syrup Take 5 mLs by mouth 3 (three) times daily as needed for cough. (Patient not taking: Reported on 07/20/2022)   No facility-administered medications prior to visit.    Review of Systems     Objective    BP 118/80 (BP Location: Right Arm, Patient Position: Sitting, Cuff Size: Normal)   Pulse 83   Temp 98.3 F (36.8 C) (Oral)   Resp 14   Ht 5' 2.5" (1.588 m)   Wt 172 lb 3.2 oz (78.1 kg)   SpO2 100%   BMI 30.99 kg/m      Physical Exam Vitals and nursing note reviewed.  Constitutional:      General: She is awake. She is not in acute distress.    Appearance: Normal appearance. She is well-developed and well-groomed. She is obese. She is not ill-appearing, toxic-appearing or diaphoretic.  HENT:     Head: Normocephalic and atraumatic.     Jaw: There is normal jaw occlusion. No trismus, tenderness, swelling or pain on movement.     Right Ear: Hearing, tympanic membrane, ear canal and external ear normal. There is no impacted cerumen.     Left Ear: Hearing, tympanic membrane, ear canal and external ear normal. There is no impacted cerumen.     Nose: Nose normal. No congestion or rhinorrhea.     Right Turbinates: Not enlarged, swollen or pale.     Left Turbinates: Not enlarged, swollen or pale.     Right Sinus: No maxillary sinus tenderness or frontal sinus tenderness.     Left Sinus: No maxillary sinus tenderness or frontal sinus tenderness.     Mouth/Throat:     Lips: Pink.     Mouth: Mucous membranes are moist. No injury.     Tongue: No lesions.     Pharynx: Oropharynx is clear. Uvula midline. No pharyngeal swelling, oropharyngeal exudate, posterior oropharyngeal erythema or uvula swelling.     Tonsils: No tonsillar exudate or  tonsillar abscesses.  Eyes:     General: Lids are normal. Lids are everted, no foreign bodies appreciated. Vision grossly intact. Gaze aligned appropriately. No allergic shiner or visual field deficit.       Right eye: No discharge.        Left eye: No discharge.     Extraocular Movements:  Extraocular movements intact.     Conjunctiva/sclera: Conjunctivae normal.     Right eye: Right conjunctiva is not injected. No exudate.    Left eye: Left conjunctiva is not injected. No exudate.    Pupils: Pupils are equal, round, and reactive to light.  Neck:     Thyroid: No thyroid mass, thyromegaly or thyroid tenderness.     Vascular: No carotid bruit.     Trachea: Trachea normal.  Cardiovascular:     Rate and Rhythm: Normal rate and regular rhythm.     Pulses: Normal pulses.          Carotid pulses are 2+ on the right side and 2+ on the left side.      Radial pulses are 2+ on the right side and 2+ on the left side.       Dorsalis pedis pulses are 2+ on the right side and 2+ on the left side.       Posterior tibial pulses are 2+ on the right side and 2+ on the left side.     Heart sounds: Normal heart sounds, S1 normal and S2 normal. No murmur heard.    No friction rub. No gallop.  Pulmonary:     Effort: Pulmonary effort is normal. No respiratory distress.     Breath sounds: Normal breath sounds and air entry. No stridor. No wheezing, rhonchi or rales.  Chest:     Chest wall: No tenderness.  Abdominal:     General: Abdomen is flat. Bowel sounds are normal. There is no distension.     Palpations: Abdomen is soft. There is no mass.     Tenderness: There is no abdominal tenderness. There is no right CVA tenderness, left CVA tenderness, guarding or rebound.     Hernia: No hernia is present.  Genitourinary:    Comments: Exam deferred; denies complaints Musculoskeletal:        General: Swelling present. No tenderness, deformity or signs of injury. Normal range of motion.     Cervical back: Full  passive range of motion without pain, normal range of motion and neck supple. No edema, rigidity or tenderness. No muscular tenderness.     Right lower leg: No edema.     Left lower leg: No edema.     Comments: Baseline edema in ankles; worse depending on diet/ambulation/water intake.  Lymphadenopathy:     Cervical: No cervical adenopathy.     Right cervical: No superficial, deep or posterior cervical adenopathy.    Left cervical: No superficial, deep or posterior cervical adenopathy.  Skin:    General: Skin is warm and dry.     Capillary Refill: Capillary refill takes less than 2 seconds.     Coloration: Skin is not jaundiced or pale.     Findings: No bruising, erythema, lesion or rash.  Neurological:     General: No focal deficit present.     Mental Status: She is alert and oriented to person, place, and time. Mental status is at baseline.     GCS: GCS eye subscore is 4. GCS verbal subscore is 5. GCS motor subscore is 6.     Sensory: Sensation is intact. No sensory deficit.     Motor: Motor function is intact. No weakness.     Coordination: Coordination is intact. Coordination normal.     Gait: Gait is intact. Gait normal.  Psychiatric:        Attention and Perception: Attention and perception normal.  Mood and Affect: Mood and affect normal.        Speech: Speech normal.        Behavior: Behavior normal. Behavior is cooperative.        Thought Content: Thought content normal.        Cognition and Memory: Cognition and memory normal.        Judgment: Judgment normal.     Last depression screening scores    07/20/2022   10:05 AM 04/02/2022    2:37 PM 08/28/2021    3:31 PM  PHQ 2/9 Scores  PHQ - 2 Score 2 0 0  PHQ- 9 Score 2 2 0   Last fall risk screening    07/20/2022   10:05 AM  Fall Risk   Falls in the past year? 0  Number falls in past yr: 0  Injury with Fall? 0  Risk for fall due to : No Fall Risks  Follow up Falls evaluation completed   Last Audit-C alcohol  use screening    07/20/2022   10:06 AM  Alcohol Use Disorder Test (AUDIT)  1. How often do you have a drink containing alcohol? 0  2. How many drinks containing alcohol do you have on a typical day when you are drinking? 0  3. How often do you have six or more drinks on one occasion? 0  AUDIT-C Score 0   A score of 3 or more in women, and 4 or more in men indicates increased risk for alcohol abuse, EXCEPT if all of the points are from question 1   No results found for any visits on 07/20/22.  Assessment & Plan    Routine Health Maintenance and Physical Exam  Exercise Activities and Dietary recommendations  Goals   None     Immunization History  Administered Date(s) Administered   Influenza,inj,Quad PF,6+ Mos 12/16/2016, 12/08/2017   Influenza-Unspecified 12/11/2021   MMR 10/24/1989   PFIZER Comirnaty(Gray Top)Covid-19 Tri-Sucrose Vaccine 02/28/2020   PFIZER(Purple Top)SARS-COV-2 Vaccination 06/18/2019, 07/09/2019, 02/28/2020   Td 10/24/1989   Tdap 05/15/2010, 07/05/2020   Zoster Recombinat (Shingrix) 01/19/2022, 06/18/2022    Health Maintenance  Topic Date Due   COLONOSCOPY (Pts 45-13yrs Insurance coverage will need to be confirmed)  Never done   COVID-19 Vaccine (5 - 2023-24 season) 11/10/2021   INFLUENZA VACCINE  10/11/2022   PAP SMEAR-Modifier  11/09/2023   MAMMOGRAM  12/22/2023   DTaP/Tdap/Td (4 - Td or Tdap) 07/06/2030   Hepatitis C Screening  Completed   HIV Screening  Completed   Zoster Vaccines- Shingrix  Completed   HPV VACCINES  Aged Out    Discussed health benefits of physical activity, and encouraged her to engage in regular exercise appropriate for her age and condition.  Problem List Items Addressed This Visit       Cardiovascular and Mediastinum   Varicose vein of leg    Chronic, ongoing pain/inflammation with associated edema Request for consult with vascular to discuss options Encouraged to use footless compression during the summer months to  assist with edema/pain       Relevant Orders   Ambulatory referral to Vascular Surgery     Musculoskeletal and Integument   Skin tag    Chronic, with occasional inflammation Referral to derm to discuss options for removal       Relevant Orders   Ambulatory referral to Dermatology     Other   Annual physical exam - Primary    Continues to work at [employer]; kids coming  in from out of town for Mother's day UTD on dental and vision Missed colonoscopy and request for additional referral  Things to do to keep yourself healthy  - Exercise at least 30-45 minutes a day, 3-4 days a week.  - Eat a low-fat diet with lots of fruits and vegetables, up to 7-9 servings per day.  - Seatbelts can save your life. Wear them always.  - Smoke detectors on every level of your home, check batteries every year.  - Eye Doctor - have an eye exam every 1-2 years  - Safe sex - if you may be exposed to STDs, use a condom.  - Alcohol -  If you drink, do it moderately, less than 2 drinks per day.  - Health Care Power of Attorney. Choose someone to speak for you if you are not able.  - Depression is common in our stressful world.If you're feeling down or losing interest in things you normally enjoy, please come in for a visit.  - Violence - If anyone is threatening or hurting you, please call immediately.       Relevant Orders   CBC with Differential/Platelet   Comprehensive Metabolic Panel (CMET)   Lipid panel   TSH   B12 deficiency    Chronic, on supplementation Reports having more energy and less leg pain since starting supplementation      Relevant Orders   B12 and Folate Panel   Class 1 obesity without serious comorbidity with body mass index (BMI) of 30.0 to 30.9 in adult    Chronic, stable Body mass index is 30.99 kg/m. Discussed importance of healthy weight management Discussed diet and exercise       Elevated serum glucose    Recommend A1c with Body mass index is 30.99 kg/m. Continue  to recommend balanced, lower carb meals. Smaller meal size, adding snacks. Choosing water as drink of choice and increasing purposeful exercise.       Relevant Orders   Hemoglobin A1c   Encounter for screening mammogram for malignant neoplasm of breast    Due for screening for mammogram, denies breast concerns, provided with phone number to call and schedule appointment for mammogram. Encouraged to repeat breast cancer screening every 1-2 years.       Relevant Orders   MM 3D SCREENING MAMMOGRAM BILATERAL BREAST   Iron deficiency anemia    Chronic, on supplementation Reports having more energy and less leg pain since starting supplementation      Relevant Orders   CBC with Differential/Platelet   Iron, TIBC and Ferritin Panel   Moderate mixed hyperlipidemia not requiring statin therapy    Chronic, previously elevated Repeat LP recommend diet low in saturated fat and regular exercise - 30 min at least 5 times per week The 10-year ASCVD risk score (Arnett DK, et al., 2019) is: 2%       Relevant Orders   Lipid panel   Screening for colon cancer    Missed previous referral d/t illness Request for repeat referral to Dr Allegra Lai No changes in risk factors or s/s       Relevant Orders   Ambulatory referral to Gastroenterology   Return in about 1 year (around 07/22/2023) for annual examination.    Leilani Merl, FNP, have reviewed all documentation for this visit. The documentation on 07/20/22 for the exam, diagnosis, procedures, and orders are all accurate and complete.  Jacky Kindle, FNP  [facility] [phone] (phone) [phone] (fax)   Medical Group  

## 2022-07-20 NOTE — Assessment & Plan Note (Signed)
Chronic, ongoing pain/inflammation with associated edema Request for consult with vascular to discuss options Encouraged to use footless compression during the summer months to assist with edema/pain

## 2022-07-20 NOTE — Patient Instructions (Signed)
Please call and schedule your mammogram October 2024  [facility]  [address], [address] [facility] [place],  [place]  [address] Get Driving Directions Main: [phone]  Sunday:Closed Monday:7:20 AM - 5:00 PM Tuesday:7:20 AM - 5:00 PM Wednesday:7:20 AM - 5:00 PM Thursday:7:20 AM - 5:00 PM Friday:7:20 AM - 4:30 PM Saturday:Closed

## 2022-07-20 NOTE — Assessment & Plan Note (Signed)
Chronic, on supplementation Reports having more energy and less leg pain since starting supplementation

## 2022-07-20 NOTE — Assessment & Plan Note (Signed)
Recommend A1c with Body mass index is 30.99 kg/m. Continue to recommend balanced, lower carb meals. Smaller meal size, adding snacks. Choosing water as drink of choice and increasing purposeful exercise.

## 2022-07-20 NOTE — Assessment & Plan Note (Signed)
Chronic, stable Body mass index is 30.99 kg/m. Discussed importance of healthy weight management Discussed diet and exercise

## 2022-07-20 NOTE — Assessment & Plan Note (Signed)
Continues to work at [employer]; kids coming in from out of town for [employer] day UTD on dental and vision Missed colonoscopy and request for additional referral  Things to do to keep yourself healthy  - Exercise at least 30-45 minutes a day, 3-4 days a week.  - Eat a low-fat diet with lots of fruits and vegetables, up to 7-9 servings per day.  - Seatbelts can save your life. Wear them always.  - Smoke detectors on every level of your home, check batteries every year.  - Eye Doctor - have an eye exam every 1-2 years  - Safe sex - if you may be exposed to STDs, use a condom.  - Alcohol -  If you drink, do it moderately, less than 2 drinks per day.  - Health Care Power of Attorney. Choose someone to speak for you if you are not able.  - Depression is common in our stressful world.If you're feeling down or losing interest in things you normally enjoy, please come in for a visit.  - Violence - If anyone is threatening or hurting you, please call immediately.

## 2022-07-21 LAB — CBC WITH DIFFERENTIAL/PLATELET
Basophils Absolute: 0.1 10*3/uL (ref 0.0–0.2)
Basos: 1 %
EOS (ABSOLUTE): 0.4 10*3/uL (ref 0.0–0.4)
Eos: 5 %
Hematocrit: 42.6 % (ref 34.0–46.6)
Hemoglobin: 13.7 g/dL (ref 11.1–15.9)
Immature Grans (Abs): 0 10*3/uL (ref 0.0–0.1)
Immature Granulocytes: 0 %
Lymphocytes Absolute: 1.8 10*3/uL (ref 0.7–3.1)
Lymphs: 22 %
MCH: 26.9 pg (ref 26.6–33.0)
MCHC: 32.2 g/dL (ref 31.5–35.7)
MCV: 84 fL (ref 79–97)
Monocytes Absolute: 0.5 10*3/uL (ref 0.1–0.9)
Monocytes: 6 %
Neutrophils Absolute: 5.7 10*3/uL (ref 1.4–7.0)
Neutrophils: 66 %
Platelets: 453 10*3/uL — ABNORMAL HIGH (ref 150–450)
RBC: 5.1 x10E6/uL (ref 3.77–5.28)
RDW: 13.1 % (ref 11.7–15.4)
WBC: 8.4 10*3/uL (ref 3.4–10.8)

## 2022-07-21 LAB — COMPREHENSIVE METABOLIC PANEL
ALT: 19 IU/L (ref 0–32)
AST: 25 IU/L (ref 0–40)
Albumin/Globulin Ratio: 1.4 (ref 1.2–2.2)
Albumin: 4.3 g/dL (ref 3.8–4.9)
Alkaline Phosphatase: 69 IU/L (ref 44–121)
BUN/Creatinine Ratio: 10 (ref 9–23)
BUN: 8 mg/dL (ref 6–24)
Bilirubin Total: 0.4 mg/dL (ref 0.0–1.2)
CO2: 21 mmol/L (ref 20–29)
Calcium: 10.4 mg/dL — ABNORMAL HIGH (ref 8.7–10.2)
Chloride: 99 mmol/L (ref 96–106)
Creatinine, Ser: 0.77 mg/dL (ref 0.57–1.00)
Globulin, Total: 3.1 g/dL (ref 1.5–4.5)
Glucose: 93 mg/dL (ref 70–99)
Potassium: 4.5 mmol/L (ref 3.5–5.2)
Sodium: 137 mmol/L (ref 134–144)
Total Protein: 7.4 g/dL (ref 6.0–8.5)
eGFR: 92 mL/min/{1.73_m2} (ref >59)

## 2022-07-21 LAB — IRON,TIBC AND FERRITIN PANEL
Ferritin: 39 ng/mL (ref 15–150)
Iron Saturation: 31 % (ref 15–55)
Iron: 127 ug/dL (ref 27–159)
Total Iron Binding Capacity: 404 ug/dL (ref 250–450)
UIBC: 277 ug/dL (ref 131–425)

## 2022-07-21 LAB — B12 AND FOLATE PANEL
Folate: >20 ng/mL (ref >3.0)
Vitamin B-12: 1476 pg/mL — ABNORMAL HIGH (ref 232–1245)

## 2022-07-21 LAB — LIPID PANEL
Chol/HDL Ratio: 5.2 ratio — ABNORMAL HIGH (ref 0.0–4.4)
Cholesterol, Total: 243 mg/dL — ABNORMAL HIGH (ref 100–199)
HDL: 47 mg/dL (ref >39)
LDL Chol Calc (NIH): 162 mg/dL — ABNORMAL HIGH (ref 0–99)
Triglycerides: 187 mg/dL — ABNORMAL HIGH (ref 0–149)
VLDL Cholesterol Cal: 34 mg/dL (ref 5–40)

## 2022-07-21 LAB — HEMOGLOBIN A1C
Est. average glucose Bld gHb Est-mCnc: 126 mg/dL
Hgb A1c MFr Bld: 6 % — ABNORMAL HIGH (ref 4.8–5.6)

## 2022-07-21 LAB — TSH: TSH: 1.58 u[IU]/mL (ref 0.450–4.500)

## 2022-07-23 NOTE — Progress Notes (Signed)
Slight increase in cholesterol; recommend diet low in saturated fat and regular exercise - 30 min at least 5 times per week. The 10-year ASCVD risk score (Arnett DK, et al., 2019) is: 2.2%  A1c notes pre-diabetes. Continue to recommend balanced, lower carb meals. Smaller meal size, adding snacks. Choosing water as drink of choice and increasing purposeful exercise. Can start metformin 750 xr once/daily if desired.  All other labs normal and stable.

## 2022-07-30 ENCOUNTER — Encounter: Payer: Self-pay | Admitting: *Deleted

## 2022-09-05 ENCOUNTER — Other Ambulatory Visit: Payer: Self-pay | Admitting: Family Medicine

## 2022-09-17 ENCOUNTER — Other Ambulatory Visit (INDEPENDENT_AMBULATORY_CARE_PROVIDER_SITE_OTHER): Payer: Self-pay | Admitting: Nurse Practitioner

## 2022-09-17 DIAGNOSIS — I83813 Varicose veins of bilateral lower extremities with pain: Secondary | ICD-10-CM

## 2022-09-20 ENCOUNTER — Other Ambulatory Visit: Payer: Self-pay | Admitting: Family Medicine

## 2022-09-20 NOTE — Telephone Encounter (Signed)
Medication Refill - Medication: XULANE 150-35 MCG/24HR transdermal patch   Pt says she was expecting for this following her CPE   Has the patient contacted their pharmacy? Yes.   (Agent: If no, request that the patient contact the pharmacy for the refill. If patient does not wish to contact the pharmacy document the reason why and proceed with request.) (Agent: If yes, when and what did the pharmacy advise?)  Preferred Pharmacy (with phone number or street name):  [facility] [phone] [address]  [address] [place] [address]  Phone: [phone] Fax: [phone]   Has the patient been seen for an appointment in the last year OR does the patient have an upcoming appointment? Yes.    Agent: Please be advised that RX refills may take up to 3 business days. We ask that you follow-up with your pharmacy.

## 2022-09-20 NOTE — Telephone Encounter (Signed)
Requested medication (s) are due for refill today: Yes  Requested medication (s) are on the active medication list: Yes  Last refill:  09/09/14  Future visit scheduled: No  Notes to clinic:  Unable to refill per protocol, last refill by another provider.      Requested Prescriptions  Pending Prescriptions Disp Refills   XULANE 150-35 MCG/24HR transdermal patch 3 patch 3    Sig: 1 patch once a week.     OB/GYN:  Contraceptives Passed - 09/20/2022 11:33 AM      Passed - Last BP in normal range    BP Readings from Last 1 Encounters:  07/20/22 118/80         Passed - Valid encounter within last 12 months    Recent Outpatient Visits           2 months ago Annual physical exam   [facility] Merita Norton T, FNP   5 months ago Post-viral cough syndrome   [place] [facility] [place], Marzella Schlein, MD   1 year ago External hemorrhoids   [place] [facility] [place], Marzella Schlein, MD   1 year ago Encounter for annual physical exam   [place] [facility] [place], Marzella Schlein, MD   1 year ago Muscle strain of right forearm, initial encounter   [facility] [place], Marzella Schlein, MD              Passed - Patient is not a smoker

## 2022-09-21 ENCOUNTER — Encounter (INDEPENDENT_AMBULATORY_CARE_PROVIDER_SITE_OTHER): Payer: Self-pay

## 2022-09-21 ENCOUNTER — Encounter (INDEPENDENT_AMBULATORY_CARE_PROVIDER_SITE_OTHER): Payer: Self-pay | Admitting: Nurse Practitioner

## 2022-09-21 MED ORDER — XULANE 150-35 MCG/24HR TD PTWK: 1.0000 | MEDICATED_PATCH | TRANSDERMAL | 3 refills | Status: DC

## 2022-12-02 ENCOUNTER — Other Ambulatory Visit: Payer: Self-pay | Admitting: Family Medicine

## 2022-12-04 NOTE — Telephone Encounter (Signed)
Requested Prescriptions  Pending Prescriptions Disp Refills   XULANE 150-35 MCG/24HR transdermal patch [Pharmacy Med Name: Burr Medico 150-35 MCG/DAY PATCH] 3 patch 0    Sig: APPLY 1 PATCH ONCE A WEEK     OB/GYN:  Contraceptives Passed - 12/02/2022  8:46 AM      Passed - Last BP in normal range    BP Readings from Last 1 Encounters:  07/20/22 118/80         Passed - Valid encounter within last 12 months    Recent Outpatient Visits           4 months ago Annual physical exam   [facility] Jacky Kindle, FNP   8 months ago Post-viral cough syndrome   [place] [facility] [place], Marzella Schlein, MD   1 year ago External hemorrhoids   [place] [facility] [place], Marzella Schlein, MD   1 year ago Encounter for annual physical exam   [facility] [facility] [place], Marzella Schlein, MD   1 year ago Muscle strain of right forearm, initial encounter   [facility] [place], Marzella Schlein, MD              Passed - Patient is not a smoker

## 2022-12-16 ENCOUNTER — Other Ambulatory Visit: Payer: Self-pay | Admitting: Family Medicine

## 2022-12-17 NOTE — Telephone Encounter (Signed)
Requested medication (s) are due for refill today: Yes  Requested medication (s) are on the active medication list: Yes  Last refill:  09/05/22  Future visit scheduled:   Notes to clinic:  See request.    Requested Prescriptions  Pending Prescriptions Disp Refills   Iron-FA-B Cmp-C-Biot-Probiotic (FUSION PLUS) CAPS [Pharmacy Med Name: FUSION PLUS CAPSULE] 30 capsule 2    Sig: TAKE 1 CAPSULE BY MOUTH EVERY DAY     There is no refill protocol information for this order

## 2022-12-24 ENCOUNTER — Ambulatory Visit
Admission: RE | Admit: 2022-12-24 | Discharge: 2022-12-24 | Disposition: A | Discharge status: Home / Self Care | Payer: BC Managed Care – PPO | Source: Ambulatory Visit | Attending: Family Medicine | Admitting: Family Medicine

## 2022-12-24 DIAGNOSIS — Z1231 Encounter for screening mammogram for malignant neoplasm of breast: Secondary | ICD-10-CM | POA: Diagnosis present

## 2022-12-27 NOTE — Progress Notes (Signed)
Normal/negative mammogram, repeat in 1 year

## 2023-01-02 ENCOUNTER — Other Ambulatory Visit: Payer: Self-pay | Admitting: Family Medicine

## 2023-01-04 NOTE — Telephone Encounter (Signed)
LMTCB-ok for [facility] nurse to give provider's message to patient

## 2023-01-04 NOTE — Telephone Encounter (Signed)
Patient has not started HRT yet wants to talk to Dr B prior to starting it.

## 2023-01-25 ENCOUNTER — Other Ambulatory Visit: Payer: Self-pay | Admitting: Family Medicine

## 2023-01-25 NOTE — Telephone Encounter (Signed)
Requested Prescriptions  Pending Prescriptions Disp Refills   XULANE 150-35 MCG/24HR transdermal patch [Pharmacy Med Name: Burr Medico 150-35 MCG/DAY PATCH] 3 patch 0    Sig: APPLY 1 PATCH ONCE A WEEK     OB/GYN:  Contraceptives Passed - 01/25/2023  1:37 AM      Passed - Last BP in normal range    BP Readings from Last 1 Encounters:  07/20/22 118/80         Passed - Valid encounter within last 12 months    Recent Outpatient Visits           6 months ago Annual physical exam   [facility] Jacky Kindle, FNP   9 months ago Post-viral cough syndrome   [place] [facility] [place], Marzella Schlein, MD   1 year ago External hemorrhoids   [facility] [place], Marzella Schlein, MD   1 year ago Encounter for annual physical exam   [place] [facility] [place], Marzella Schlein, MD   1 year ago Muscle strain of right forearm, initial encounter   [facility] [place], Marzella Schlein, MD              Passed - Patient is not a smoker

## 2023-02-15 ENCOUNTER — Other Ambulatory Visit: Payer: Self-pay | Admitting: Family Medicine

## 2023-02-15 NOTE — Telephone Encounter (Signed)
Requested Prescriptions  Pending Prescriptions Disp Refills   XULANE 150-35 MCG/24HR transdermal patch [Pharmacy Med Name: Burr Medico 150-35 MCG/DAY PATCH] 12 patch 1    Sig: APPLY 1 PATCH ONCE A WEEK     OB/GYN:  Contraceptives Passed - 02/15/2023  1:43 AM      Passed - Last BP in normal range    BP Readings from Last 1 Encounters:  07/20/22 118/80         Passed - Valid encounter within last 12 months    Recent Outpatient Visits           7 months ago Annual physical exam   [facility] Jacky Kindle, FNP   10 months ago Post-viral cough syndrome   [place] [facility] [place], Marzella Schlein, MD   1 year ago External hemorrhoids   [facility] [place], Marzella Schlein, MD   1 year ago Encounter for annual physical exam    [facility] [place], Marzella Schlein, MD   1 year ago Muscle strain of right forearm, initial encounter   [facility] [place], Marzella Schlein, MD              Passed - Patient is not a smoker

## 2023-02-25 ENCOUNTER — Ambulatory Visit: Payer: Self-pay | Admitting: *Deleted

## 2023-02-25 NOTE — Telephone Encounter (Signed)
  Chief Complaint: Patient would like provider recommendation for daily iron suppliment Symptoms: fatigue, leg cramps- not all the time   Disposition: [] ED /[] Urgent Care (no appt availability in office) / [] Appointment(In office/virtual)/ []  [place] Virtual [facility]/ [] Home Care/ [] Refused Recommended Disposition /[] [place] Mobile Bus/ [x]  Follow-up with PCP Additional Notes: Patient would like provider advice- What would be recommended for daily iron supplement? Patient currently takes One a Day multi women over 60 and vitamin B . Patient states message may be left on VM if she does not answer.

## 2023-02-25 NOTE — Telephone Encounter (Signed)
Summary: vitamin iron questions   Question regarding iron supplements, suggestion on what kind to take and dosage?         Reason for Disposition . Caller wants to use a complementary or alternative medicine  Answer Assessment - Initial Assessment Questions 1. NAME of MEDICINE: "What medicine(s) are you calling about?"     Patient has questions about iron suppliment 2. QUESTION: "What is your question?" (e.g., double dose of medicine, side effect)     Patient would like to take something comperable to the fusion she has taken in the past 3. PRESCRIBER: "Who prescribed the medicine?" Reason: if prescribed by specialist, call should be referred to that group.     PCP 4. SYMPTOMS: "Do you have any symptoms?" If Yes, ask: "What symptoms are you having?"  "How bad are the symptoms (e.g., mild, moderate, severe)     Sometimes feels fatigued, leg cramps   Patient would like provider advice- What would be recommended for daily iron supplement? Patient currently takes One a Day multi women over 60 and vitamin B  Protocols used: Medication Question Call-A-AH

## 2023-02-26 NOTE — Telephone Encounter (Signed)
Pt advised verbalized understanding Also sent link to register with mychart

## 2023-02-26 NOTE — Telephone Encounter (Signed)
Ferrous sulfate 325 mg daily

## 2023-03-26 ENCOUNTER — Telehealth: Payer: Self-pay

## 2023-03-26 ENCOUNTER — Other Ambulatory Visit: Payer: Self-pay

## 2023-03-26 DIAGNOSIS — Z1211 Encounter for screening for malignant neoplasm of colon: Secondary | ICD-10-CM

## 2023-03-26 MED ORDER — NA SULFATE-K SULFATE-MG SULF 17.5-3.13-1.6 GM/177ML PO SOLN
354.0000 mL | Freq: Once | ORAL | 0 refills | Status: AC
Start: 2023-03-26 — End: 2023-03-26

## 2023-03-26 NOTE — Telephone Encounter (Signed)
 Agree with canceling her EGD at this time since her iron deficiency anemia has resolved  RV

## 2023-03-26 NOTE — Telephone Encounter (Signed)
 Patient left a message and wants to schedule a colonoscopy on 05/31/2023

## 2023-03-26 NOTE — Telephone Encounter (Signed)
 Patient verbalized understanding of the instructions

## 2023-03-26 NOTE — Telephone Encounter (Signed)
 Called patient and schedule patient for 05/31/2023. She had new insurance for 2025 added that to her chart and she is going to email me the front and back of her insurance card. Went over instructions, mailed them to patient. Patient was schedule for for colonoscopy when she had a office visit on 02/14/2022. She canceled that procedure. At that time she was needing a EGD also. She states now her iron level is normal according to her PCP and is only taking OTC iron. She wants to know if you feel like she still needs a EGD?

## 2023-03-26 NOTE — Telephone Encounter (Signed)
 Pt requesting call back to schedule colonoscopy.

## 2023-05-31 ENCOUNTER — Ambulatory Visit: Admitting: Certified Registered"

## 2023-05-31 ENCOUNTER — Ambulatory Visit
Admission: RE | Admit: 2023-05-31 | Discharge: 2023-05-31 | Disposition: A | Discharge status: Home / Self Care | Payer: 59 | Attending: Gastroenterology | Admitting: Gastroenterology

## 2023-05-31 ENCOUNTER — Encounter: Payer: Self-pay | Admitting: Gastroenterology

## 2023-05-31 ENCOUNTER — Encounter
Admission: RE | Disposition: A | Discharge status: Home / Self Care | Payer: Self-pay | Source: Home / Self Care | Attending: Gastroenterology

## 2023-05-31 DIAGNOSIS — K5730 Diverticulosis of large intestine without perforation or abscess without bleeding: Secondary | ICD-10-CM | POA: Diagnosis not present

## 2023-05-31 DIAGNOSIS — Z1211 Encounter for screening for malignant neoplasm of colon: Secondary | ICD-10-CM | POA: Diagnosis not present

## 2023-05-31 DIAGNOSIS — K635 Polyp of colon: Secondary | ICD-10-CM

## 2023-05-31 DIAGNOSIS — D120 Benign neoplasm of cecum: Secondary | ICD-10-CM | POA: Diagnosis not present

## 2023-05-31 HISTORY — PX: COLONOSCOPY WITH PROPOFOL: SHX5780

## 2023-05-31 HISTORY — PX: POLYPECTOMY: SHX5525

## 2023-05-31 SURGERY — COLONOSCOPY WITH PROPOFOL
Anesthesia: General

## 2023-05-31 MED ORDER — LIDOCAINE HCL (PF) 2 % IJ SOLN
INTRAMUSCULAR | Status: DC | PRN
Start: 2023-05-31 — End: 2023-05-31
  Administered 2023-05-31: 40 mg via INTRADERMAL

## 2023-05-31 MED ORDER — EPHEDRINE SULFATE-NACL 50-0.9 MG/10ML-% IV SOSY
PREFILLED_SYRINGE | INTRAVENOUS | Status: DC | PRN
  Administered 2023-05-31: 10 mg via INTRAVENOUS

## 2023-05-31 MED ORDER — EPHEDRINE 5 MG/ML INJ
INTRAVENOUS | Status: AC
Start: 2023-05-31 — End: ?
  Filled 2023-05-31: qty 5

## 2023-05-31 MED ORDER — PHENYLEPHRINE 80 MCG/ML (10ML) SYRINGE FOR IV PUSH (FOR BLOOD PRESSURE SUPPORT)
PREFILLED_SYRINGE | INTRAVENOUS | Status: AC
  Filled 2023-05-31: qty 10

## 2023-05-31 MED ORDER — LIDOCAINE HCL (PF) 2 % IJ SOLN
INTRAMUSCULAR | Status: AC
  Filled 2023-05-31: qty 5

## 2023-05-31 MED ORDER — SODIUM CHLORIDE 0.9 % IV SOLN: INTRAVENOUS | Status: DC

## 2023-05-31 MED ORDER — PROPOFOL 10 MG/ML IV BOLUS
INTRAVENOUS | Status: AC
  Filled 2023-05-31: qty 40

## 2023-05-31 MED ORDER — PROPOFOL 500 MG/50ML IV EMUL
INTRAVENOUS | Status: DC | PRN
  Administered 2023-05-31: 120 mg via INTRAVENOUS
  Administered 2023-05-31: 160 ug/kg/min via INTRAVENOUS

## 2023-05-31 NOTE — Transfer of Care (Signed)
 Immediate Anesthesia Transfer of Care Note  Patient: Joan Hunt  Procedure(s) Performed: COLONOSCOPY WITH PROPOFOL POLYPECTOMY  Patient Location: PACU  Anesthesia Type:General  Level of Consciousness: drowsy  Airway & Oxygen Therapy: Patient Spontanous Breathing  Post-op Assessment: Report given to RN and Post -op Vital signs reviewed and stable  Post vital signs: Reviewed and stable  Last Vitals:  Vitals Value Taken Time  BP 117/62 0838  Temp 35.9 0838  Pulse 101 05/31/23 0839  Resp 18 05/31/23 0839  SpO2 96 % 05/31/23 0839  Vitals shown include unfiled device data.  Last Pain:  Vitals:   05/31/23 0731  TempSrc: Temporal  PainSc: 0-No pain         Complications: No notable events documented.

## 2023-05-31 NOTE — H&P (Signed)
 Arlyss Repress, MD [address]  [address]  [place], [place] [address]  Main: [phone]  Fax: [phone] Pager: [phone]  Primary Care Physician:  Erasmo Downer, MD Primary Gastroenterologist:  Dr. Arlyss Repress  Pre-Procedure History & Physical: HPI:  Joan Hunt is a 53 y.o. female is here for an colonoscopy.   Past Medical History:  Diagnosis Date   Allergy     Past Surgical History:  Procedure Laterality Date   NO PAST SURGERIES      Prior to Admission medications   Medication Sig Start Date End Date Taking? Authorizing Provider  benzonatate (TESSALON) 100 MG capsule Take 1 capsule (100 mg total) by mouth 2 (two) times daily as needed for cough. Patient not taking: Reported on 07/20/2022 04/02/22   Erasmo Downer, MD  guaiFENesin-codeine 100-10 MG/5ML syrup Take 5 mLs by mouth 3 (three) times daily as needed for cough. Patient not taking: Reported on 07/20/2022 04/17/22   Malva Limes, MD  Iron-FA-B Cmp-C-Biot-Probiotic (FUSION PLUS) CAPS TAKE 1 CAPSULE BY MOUTH EVERY DAY 12/18/22   Bacigalupo, Marzella Schlein, MD  Burr Medico 150-35 MCG/24HR transdermal patch APPLY 1 PATCH ONCE A WEEK 02/15/23   Erasmo Downer, MD    Allergies as of 03/26/2023 - Review Complete 07/20/2022  Allergen Reaction Noted   Septra [sulfamethoxazole-trimethoprim] Anaphylaxis 09/09/2014   Prednisone Other (See Comments) 04/13/2016    Family History  Problem Relation Age of Onset   Allergic rhinitis Mother    Hyperthyroidism Mother    Goiter Mother    Kidney failure Mother    Heart murmur Father    Healthy Sister    CAD Brother 73       s/p stent placement, smoker   Healthy Brother    Heart murmur Daughter    Sickle cell trait Son    Breast cancer Maternal Aunt    Diabetes Maternal Aunt    Stroke Paternal Uncle    Emphysema Maternal Grandfather    Stroke Paternal Grandmother 42   Colon cancer Neg Hx    Cervical cancer Neg Hx    Ovarian cancer Neg Hx      Social History   Socioeconomic History   Marital status: Married    Spouse name: Charles A. Janalyn Shy.   Number of children: 2   Years of education: Not on file   Highest education level: Master's degree (e.g., MA, MS, MEng, MEd, MSW, MBA)  Occupational History   Occupation: unemployed  Tobacco Use   Smoking status: Never   Smokeless tobacco: Never  Vaping Use   Vaping status: Never Used  Substance and Sexual Activity   Alcohol use: No   Drug use: Never   Sexual activity: Yes    Partners: Male    Birth control/protection: Patch  Other Topics Concern   Not on file  Social History Narrative   Not on file   Social Drivers of Health   Financial Resource Strain: Low Risk  (06/03/2017)   Overall Financial Resource Strain (CARDIA)    Difficulty of Paying Living Expenses: Not hard at all  Food Insecurity: No Food Insecurity (06/03/2017)   Hunger Vital Sign    Worried About Running Out of Food in the Last Year: Never true    Ran Out of Food in the Last Year: Never true  Transportation Needs: No Transportation Needs (06/03/2017)   PRAPARE - Administrator, Civil Service (Medical): No    Lack of Transportation (Non-Medical): No  Physical Activity: Inactive (06/03/2017)   Exercise Vital Sign    Days of Exercise per Week: 0 days    Minutes of Exercise per Session: 0 min  Stress: Not on file  Social Connections: Not on file  Intimate Partner Violence: Not on file    Review of Systems: See HPI, otherwise negative ROS  Physical Exam: BP 139/78   Pulse 99   Temp (!) 97.1 F (36.2 C) (Temporal)   Resp 18   Ht 5' 2.5" (1.588 m)   Wt 78.5 kg   SpO2 99%   BMI 31.14 kg/m  General:   Alert,  pleasant and cooperative in NAD Head:  Normocephalic and atraumatic. Neck:  Supple; no masses or thyromegaly. Lungs:  Clear throughout to auscultation.    Heart:  Regular rate and rhythm. Abdomen:  Soft, nontender and nondistended. Normal bowel sounds, without guarding, and  without rebound.   Neurologic:  Alert and  oriented x4;  grossly normal neurologically.  Impression/Plan: Joan Hunt is here for an colonoscopy to be performed for colon cancer screening  Risks, benefits, limitations, and alternatives regarding  colonoscopy have been reviewed with the patient.  Questions have been answered.  All parties agreeable.   Lannette Donath, MD  05/31/2023, 7:40 AM

## 2023-05-31 NOTE — Op Note (Signed)
 [facility]astroenterology Patient Name: Joan Hunt Procedure Date: 05/31/2023 8:15 AM MRN: [id number] Account #: [id number] Date of Birth: 06-03-1970 Admit Type: Outpatient Age: 53 Room: [facility] ENDO ROOM 2 Gender: Female Note Status: Finalized Instrument Name: Peds Colonoscope [id number] Procedure:             Colonoscopy Indications:           Screening for colorectal malignant neoplasm Providers:             Toney Reil MD, MD Referring MD:          Marzella Schlein. Bacigalupo (Referring MD) Medicines:             General Anesthesia Complications:         No immediate complications. Estimated blood loss: None. Procedure:             Pre-Anesthesia Assessment:                        - Prior to the procedure, a History and Physical was                         performed, and patient medications and allergies were                         reviewed. The patient is competent. The risks and                         benefits of the procedure and the sedation options and                         risks were discussed with the patient. All questions                         were answered and informed consent was obtained.                         Patient identification and proposed procedure were                         verified by the physician, the nurse, the                         anesthesiologist, the anesthetist and the technician                         in the pre-procedure area in the procedure room in the                         endoscopy suite. Mental Status Examination: alert and                         oriented. Airway Examination: normal oropharyngeal                         airway and neck mobility. Respiratory Examination:                         clear to auscultation. CV Examination: normal.  Prophylactic Antibiotics: The patient does not require                         prophylactic antibiotics. Prior Anticoagulants: The                          patient has taken no anticoagulant or antiplatelet                         agents. ASA Grade Assessment: II - A patient with mild                         systemic disease. After reviewing the risks and                         benefits, the patient was deemed in satisfactory                         condition to undergo the procedure. The anesthesia                         plan was to use general anesthesia. Immediately prior                         to administration of medications, the patient was                         re-assessed for adequacy to receive sedatives. The                         heart rate, respiratory rate, oxygen saturations,                         blood pressure, adequacy of pulmonary ventilation, and                         response to care were monitored throughout the                         procedure. The physical status of the patient was                         re-assessed after the procedure.                        After obtaining informed consent, the colonoscope was                         passed under direct vision. Throughout the procedure,                         the patient's blood pressure, pulse, and oxygen                         saturations were monitored continuously. The                         Colonoscope was introduced through the anus and  advanced to the the cecum, identified by appendiceal                         orifice and ileocecal valve. The colonoscopy was                         performed without difficulty. The patient tolerated                         the procedure well. The quality of the bowel                         preparation was evaluated using the BBPS [facility] Bowel                         Preparation Scale) with scores of: Right Colon = 3,                         Transverse Colon = 3 and Left Colon = 3 (entire mucosa                         seen well with no residual staining, small fragments                          of stool or opaque liquid). The total BBPS score                         equals 9. The ileocecal valve, appendiceal orifice,                         and rectum were photographed. Findings:      The perianal and digital rectal examinations were normal. Pertinent       negatives include normal sphincter tone and no palpable rectal lesions.      A diminutive polyp was found in the cecum. The polyp was sessile. The       polyp was removed with a jumbo cold forceps. Resection and retrieval       were complete. Estimated blood loss: none.      Multiple diverticula were found in the recto-sigmoid colon and sigmoid       colon.      The retroflexed view of the distal rectum and anal verge was normal and       showed no anal or rectal abnormalities. Impression:            - One diminutive polyp in the cecum, removed with a                         jumbo cold forceps. Resected and retrieved.                        - Diverticulosis in the recto-sigmoid colon and in the                         sigmoid colon.                        - The distal rectum and anal verge are normal on  retroflexion view. Recommendation:        - Discharge patient to home (with escort).                        - Resume previous diet today.                        - Continue present medications.                        - Await pathology results.                        - Repeat colonoscopy in 7-10 years for surveillance                         based on pathology results. Procedure Code(s):     --- Professional ---                        [phone], Colonoscopy, flexible; with biopsy, single or                         multiple Diagnosis Code(s):     --- Professional ---                        Z12.11, Encounter for screening for malignant neoplasm                         of colon                        K57.30, Diverticulosis of large intestine without                         perforation or abscess  without bleeding                        D12.0, Benign neoplasm of cecum CPT copyright 2022 American Medical Association. All rights reserved. The codes documented in this report are preliminary and upon coder review may  be revised to meet current compliance requirements. Dr. Libby Maw Toney Reil MD, MD 05/31/2023 8:35:59 AM This report has been signed electronically. Number of Addenda: 0 Note Initiated On: 05/31/2023 8:15 AM Scope Withdrawal Time: 0 hours 8 minutes 38 seconds  Total Procedure Duration: 0 hours 12 minutes 17 seconds  Estimated Blood Loss:  Estimated blood loss: none.      [facility]

## 2023-05-31 NOTE — Anesthesia Preprocedure Evaluation (Signed)
 Anesthesia Evaluation  Patient identified by MRN, date of birth, ID band Patient awake    Reviewed: Allergy & Precautions, NPO status , Patient's Chart, lab work & pertinent test results  Airway Mallampati: II  TM Distance: >3 FB Neck ROM: full    Dental  (+) Teeth Intact   Pulmonary neg pulmonary ROS   Pulmonary exam normal breath sounds clear to auscultation       Cardiovascular Exercise Tolerance: Good negative cardio ROS Normal cardiovascular exam Rhythm:Regular Rate:Normal     Neuro/Psych negative neurological ROS  negative psych ROS   GI/Hepatic negative GI ROS, Neg liver ROS,,,  Endo/Other  negative endocrine ROS    Renal/GU negative Renal ROS  negative genitourinary   Musculoskeletal   Abdominal   Peds negative pediatric ROS (+)  Hematology negative hematology ROS (+) Blood dyscrasia   Anesthesia Other Findings Past Medical History: No date: Allergy  Past Surgical History: No date: NO PAST SURGERIES  BMI    Body Mass Index: 31.14 kg/m      Reproductive/Obstetrics negative OB ROS                             Anesthesia Physical Anesthesia Plan  ASA: 2  Anesthesia Plan: General   Post-op Pain Management:    Induction: Intravenous  PONV Risk Score and Plan: Propofol infusion and TIVA  Airway Management Planned: Natural Airway and Nasal Cannula  Additional Equipment:   Intra-op Plan:   Post-operative Plan:   Informed Consent: I have reviewed the patients History and Physical, chart, labs and discussed the procedure including the risks, benefits and alternatives for the proposed anesthesia with the patient or authorized representative who has indicated his/her understanding and acceptance.     Dental Advisory Given  Plan Discussed with: CRNA and Surgeon  Anesthesia Plan Comments:        Anesthesia Quick Evaluation

## 2023-05-31 NOTE — Anesthesia Postprocedure Evaluation (Signed)
 Anesthesia Post Note  Patient: Joan Hunt  Procedure(s) Performed: COLONOSCOPY WITH PROPOFOL POLYPECTOMY  Patient location during evaluation: PACU Anesthesia Type: General Level of consciousness: awake and awake and alert Pain management: satisfactory to patient Vital Signs Assessment: post-procedure vital signs reviewed and stable Respiratory status: spontaneous breathing Cardiovascular status: blood pressure returned to baseline Anesthetic complications: no   No notable events documented.   Last Vitals:  Vitals:   05/31/23 0849 05/31/23 0859  BP: 111/76 119/79  Pulse:    Resp:    Temp:    SpO2:      Last Pain:  Vitals:   05/31/23 0859  TempSrc:   PainSc: 0-No pain                 VAN STAVEREN,Adren Dollins

## 2023-06-03 LAB — SURGICAL PATHOLOGY

## 2023-06-04 ENCOUNTER — Encounter: Payer: Self-pay | Admitting: Gastroenterology

## 2023-06-04 NOTE — Progress Notes (Signed)
 Surgical pathology showed no malignancy or high grade dysplasia

## 2023-06-07 ENCOUNTER — Telehealth: Payer: Self-pay

## 2023-07-02 NOTE — Telephone Encounter (Signed)
 Joan Hunt

## 2023-07-03 ENCOUNTER — Encounter: Payer: Self-pay | Admitting: Family Medicine

## 2023-07-03 ENCOUNTER — Ambulatory Visit: Admitting: Family Medicine

## 2023-07-03 ENCOUNTER — Ambulatory Visit: Payer: Self-pay

## 2023-07-03 VITALS — BP 121/69 | HR 98 | Ht 62.0 in | Wt 175.0 lb

## 2023-07-03 DIAGNOSIS — R21 Rash and other nonspecific skin eruption: Secondary | ICD-10-CM

## 2023-07-03 DIAGNOSIS — T7840XA Allergy, unspecified, initial encounter: Secondary | ICD-10-CM | POA: Diagnosis not present

## 2023-07-03 MED ORDER — METHYLPREDNISOLONE ACETATE 40 MG/ML IJ SUSP
40.0000 mg | Freq: Once | INTRAMUSCULAR | Status: AC
  Administered 2023-07-03: 40 mg via INTRAMUSCULAR

## 2023-07-03 NOTE — Telephone Encounter (Signed)
 Copied from CRM [id number]. Topic: Clinical - Red Word Triage >> Jul 03, 2023  8:43 AM Carlatta H wrote: [facility] that prompted transfer to Nurse Triage: Patient has been experiencing a rash and break out from taking some medications prescribed by Dr Faith Homes stated he legs feel like they are on fire//  Chief Complaint: rash Symptoms: itchy, painful Red, raised, bumps on bilateral legs and spreading Frequency: Sunday Pertinent Negatives: Patient denies fever Disposition: [] ED /[] Urgent Care (no appt availability in office) / [x] Appointment(In office/virtual)/ []  [place] [facility]/ [] Home Care/ [] Refused Recommended Disposition /[] [place] Mobile Bus/ []  Follow-up with PCP Additional Notes: pt has been using a topical cream due to arthritis related bilateral knees. Uses Voltaren cream & an additional OTC cream - not sure it any of these creams have created the rash.  Reason for Disposition  [1] Looks infected (spreading redness, pus) AND [2] no fever    Pt reports moderate to severe itching & painful Red, raised, bumps on bilateral legs and spreading.  Answer Assessment - Initial Assessment Questions 1. APPEARANCE of RASH: "Describe the rash."      Red, raised, bumps 2. LOCATION: "Where is the rash located?"      Bilateral legs 3. NUMBER: "How many spots are there?"      Several  4. SIZE: "How big are the spots?" (Inches, centimeters or compare to size of a coin)      Various rash and bumps 5. ONSET: "When did the rash start?"      Sunday 6. ITCHING: "Does the rash itch?" If Yes, ask: "How bad is the itch?"  (Scale 0-10; or none, mild, moderate, severe)     Moderate to severe 7. PAIN: "Does the rash hurt?" If Yes, ask: "How bad is the pain?"  (Scale 0-10; or none, mild, moderate, severe)    - NONE (0): no pain    - MILD (1-3): doesn't interfere with normal activities     - MODERATE (4-7): interferes with normal activities or awakens from sleep     - SEVERE (8-10):  excruciating pain, unable to do any normal activities     Moderate to severe 8. OTHER SYMPTOMS: "Do you have any other symptoms?" (e.g., fever)     no 9. PREGNANCY: "Is there any chance you are pregnant?" "When was your last menstrual period?"     N/a  Protocols used: Rash or Redness - Localized-A-AH
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
## 2023-07-11 ENCOUNTER — Telehealth: Payer: Self-pay | Admitting: Family Medicine

## 2023-07-11 NOTE — Telephone Encounter (Signed)
 Left voicemail that 5/12 appt needs to be rescheduled due to provider no longer available that day. Ok to schedule if she calls back

## 2023-07-16 HISTORY — PX: KNEE ARTHROSCOPY: SHX127

## 2023-07-22 ENCOUNTER — Encounter: Admitting: Family Medicine

## 2023-08-05 ENCOUNTER — Other Ambulatory Visit: Payer: Self-pay | Admitting: Family Medicine

## 2023-08-27 ENCOUNTER — Ambulatory Visit (INDEPENDENT_AMBULATORY_CARE_PROVIDER_SITE_OTHER): Admitting: Family Medicine

## 2023-08-27 ENCOUNTER — Encounter: Payer: Self-pay | Admitting: Family Medicine

## 2023-08-27 VITALS — BP 137/79 | HR 87

## 2023-08-27 DIAGNOSIS — R739 Hyperglycemia, unspecified: Secondary | ICD-10-CM

## 2023-08-27 DIAGNOSIS — Z6830 Body mass index (BMI) 30.0-30.9, adult: Secondary | ICD-10-CM | POA: Diagnosis not present

## 2023-08-27 DIAGNOSIS — I83813 Varicose veins of bilateral lower extremities with pain: Secondary | ICD-10-CM

## 2023-08-27 DIAGNOSIS — E66811 Obesity, class 1: Secondary | ICD-10-CM | POA: Diagnosis not present

## 2023-08-27 DIAGNOSIS — E538 Deficiency of other specified B group vitamins: Secondary | ICD-10-CM | POA: Diagnosis not present

## 2023-08-27 DIAGNOSIS — Z0000 Encounter for general adult medical examination without abnormal findings: Secondary | ICD-10-CM

## 2023-08-27 DIAGNOSIS — E782 Mixed hyperlipidemia: Secondary | ICD-10-CM

## 2023-08-27 DIAGNOSIS — E6609 Other obesity due to excess calories: Secondary | ICD-10-CM | POA: Diagnosis not present

## 2023-08-27 DIAGNOSIS — N951 Menopausal and female climacteric states: Secondary | ICD-10-CM

## 2023-08-27 DIAGNOSIS — D500 Iron deficiency anemia secondary to blood loss (chronic): Secondary | ICD-10-CM

## 2023-08-27 NOTE — Assessment & Plan Note (Signed)
 Varicose veins present with a family history of severe varicosities. She uses compression socks in fall and winter but finds them uncomfortable in warmer months. No current severe symptoms requiring intervention. - Recommend continued use of compression socks during cooler months. - Discuss potential for vascular intervention if symptoms worsen, noting insurance coverage challenges.
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
## 2023-08-28 LAB — CBC WITH DIFFERENTIAL/PLATELET
Basophils Absolute: 0.1 10*3/uL (ref 0.0–0.2)
Basos: 1 %
EOS (ABSOLUTE): 0.3 10*3/uL (ref 0.0–0.4)
Eos: 3 %
Hematocrit: 39.8 % (ref 34.0–46.6)
Hemoglobin: 12.6 g/dL (ref 11.1–15.9)
Immature Grans (Abs): 0 10*3/uL (ref 0.0–0.1)
Immature Granulocytes: 0 %
Lymphocytes Absolute: 2 10*3/uL (ref 0.7–3.1)
Lymphs: 20 %
MCH: 27 pg (ref 26.6–33.0)
MCHC: 31.7 g/dL (ref 31.5–35.7)
MCV: 85 fL (ref 79–97)
Monocytes Absolute: 0.5 10*3/uL (ref 0.1–0.9)
Monocytes: 5 %
Neutrophils Absolute: 7.2 10*3/uL — ABNORMAL HIGH (ref 1.4–7.0)
Neutrophils: 71 %
Platelets: 549 10*3/uL — ABNORMAL HIGH (ref 150–450)
RBC: 4.67 x10E6/uL (ref 3.77–5.28)
RDW: 13 % (ref 11.7–15.4)
WBC: 10.1 10*3/uL (ref 3.4–10.8)

## 2023-08-28 LAB — IRON,TIBC AND FERRITIN PANEL
Ferritin: 45 ng/mL (ref 15–150)
Iron Saturation: 15 % (ref 15–55)
Iron: 62 ug/dL (ref 27–159)
Total Iron Binding Capacity: 402 ug/dL (ref 250–450)
UIBC: 340 ug/dL (ref 131–425)

## 2023-08-28 LAB — COMPREHENSIVE METABOLIC PANEL WITH GFR
ALT: 11 IU/L (ref 0–32)
AST: 13 IU/L (ref 0–40)
Albumin: 4.3 g/dL (ref 3.8–4.9)
Alkaline Phosphatase: 76 IU/L (ref 44–121)
BUN/Creatinine Ratio: 11 (ref 9–23)
BUN: 8 mg/dL (ref 6–24)
Bilirubin Total: 0.3 mg/dL (ref 0.0–1.2)
CO2: 21 mmol/L (ref 20–29)
Calcium: 10.1 mg/dL (ref 8.7–10.2)
Chloride: 97 mmol/L (ref 96–106)
Creatinine, Ser: 0.75 mg/dL (ref 0.57–1.00)
Globulin, Total: 3 g/dL (ref 1.5–4.5)
Glucose: 88 mg/dL (ref 70–99)
Potassium: 4.8 mmol/L (ref 3.5–5.2)
Sodium: 135 mmol/L (ref 134–144)
Total Protein: 7.3 g/dL (ref 6.0–8.5)
eGFR: 94 mL/min/{1.73_m2} (ref >59)

## 2023-08-28 LAB — VITAMIN B12: Vitamin B-12: 642 pg/mL (ref 232–1245)

## 2023-08-28 LAB — LIPID PANEL WITH LDL/HDL RATIO
Cholesterol, Total: 244 mg/dL — ABNORMAL HIGH (ref 100–199)
HDL: 48 mg/dL (ref >39)
LDL Chol Calc (NIH): 148 mg/dL — ABNORMAL HIGH (ref 0–99)
LDL/HDL Ratio: 3.1 ratio (ref 0.0–3.2)
Triglycerides: 264 mg/dL — ABNORMAL HIGH (ref 0–149)
VLDL Cholesterol Cal: 48 mg/dL — ABNORMAL HIGH (ref 5–40)

## 2023-08-28 LAB — HEMOGLOBIN A1C
Est. average glucose Bld gHb Est-mCnc: 123 mg/dL
Hgb A1c MFr Bld: 5.9 % — ABNORMAL HIGH (ref 4.8–5.6)

## 2023-08-30 ENCOUNTER — Ambulatory Visit: Payer: Self-pay | Admitting: Family Medicine

## 2023-10-11 ENCOUNTER — Telehealth: Payer: Self-pay

## 2023-10-11 NOTE — Telephone Encounter (Signed)
 Ok to see what her questions are. We do not routinely check blood type in non-pregnant adults that do not need a transfusion.  There is no indication, so insurance is unlikely to cover in this scenario.  If you donate blood, they will tell you your blood type.

## 2023-10-11 NOTE — Telephone Encounter (Signed)
 Copied from CRM [id number]. Topic: Clinical - Medication Question >> Oct 11, 2023 11:15 AM Joan Hunt wrote: Reason for CRM: Pt called in about having some questions about her health and also would like to know her Blood Type.   Pt callback [phone]

## 2023-10-14 NOTE — Telephone Encounter (Signed)
 Spoke with patient, advised of Dr. Rona message. Patient will try reaching out to obgyn office to see if they have her blood type on record.

## 2023-10-14 NOTE — Telephone Encounter (Signed)
 Left voicemail for patient to return call.

## 2023-12-17 ENCOUNTER — Other Ambulatory Visit: Payer: Self-pay | Admitting: Obstetrics and Gynecology

## 2023-12-17 DIAGNOSIS — Z1231 Encounter for screening mammogram for malignant neoplasm of breast: Secondary | ICD-10-CM

## 2024-01-14 ENCOUNTER — Ambulatory Visit
Admission: RE | Admit: 2024-01-14 | Discharge: 2024-01-14 | Disposition: A | Discharge status: Home / Self Care | Source: Ambulatory Visit | Attending: Obstetrics and Gynecology | Admitting: Obstetrics and Gynecology

## 2024-01-14 DIAGNOSIS — Z1231 Encounter for screening mammogram for malignant neoplasm of breast: Secondary | ICD-10-CM | POA: Insufficient documentation

## 2024-01-16 ENCOUNTER — Ambulatory Visit: Payer: Self-pay | Admitting: Family Medicine

## 2024-02-09 ENCOUNTER — Other Ambulatory Visit: Payer: Self-pay | Admitting: Family Medicine

## 2024-02-10 NOTE — Telephone Encounter (Signed)
 LOV- 08/27/2023 NOV- 08/27/2024 LRF- 08/06/2023 Outpatient Medication Detail   Disp Refills Start End   XULANE  150-35 MCG/24HR transdermal patch 12 patch 1 08/06/2023 --   Sig: APPLY 1 PATCH ONCE A WEEK   Sent to pharmacy as: XULANE  150-35 MCG/24HR transdermal patch   E-Prescribing Status: Receipt confirmed by pharmacy (08/06/2023 11:04 AM EDT)

## 2024-08-27 ENCOUNTER — Encounter: Admitting: Family Medicine
